# Patient Record
Sex: Female | Born: 1960 | Race: Black or African American | Hispanic: No | Marital: Single | Smoking: Never smoker
Health system: Southern US, Community
[De-identification: ages and names within clinical notes are randomized; demographics above are authoritative.]

## PROBLEM LIST (undated history)

## (undated) DIAGNOSIS — I10 Essential (primary) hypertension: Secondary | ICD-10-CM

## (undated) DIAGNOSIS — E119 Type 2 diabetes mellitus without complications: Secondary | ICD-10-CM

## (undated) DIAGNOSIS — K219 Gastro-esophageal reflux disease without esophagitis: Secondary | ICD-10-CM

## (undated) HISTORY — PX: KNEE ARTHROSCOPY: SUR90

## (undated) HISTORY — PX: CARPAL TUNNEL RELEASE: SHX101

## (undated) HISTORY — PX: LAPAROSCOPIC GASTRIC SLEEVE RESECTION: SHX5895

## (undated) HISTORY — DX: Essential (primary) hypertension: I10

---

## 2011-06-24 ENCOUNTER — Ambulatory Visit (INDEPENDENT_AMBULATORY_CARE_PROVIDER_SITE_OTHER): Payer: Managed Care, Other (non HMO)

## 2011-06-24 DIAGNOSIS — R5383 Other fatigue: Secondary | ICD-10-CM

## 2011-06-24 DIAGNOSIS — R05 Cough: Secondary | ICD-10-CM

## 2011-07-25 ENCOUNTER — Ambulatory Visit (INDEPENDENT_AMBULATORY_CARE_PROVIDER_SITE_OTHER): Payer: Managed Care, Other (non HMO) | Admitting: Family Medicine

## 2011-07-25 VITALS — BP 168/123 | HR 85 | Temp 98.7°F | Resp 18 | Ht 64.0 in | Wt 220.6 lb

## 2011-07-25 DIAGNOSIS — I10 Essential (primary) hypertension: Secondary | ICD-10-CM

## 2011-07-25 DIAGNOSIS — M79609 Pain in unspecified limb: Secondary | ICD-10-CM

## 2011-07-25 DIAGNOSIS — G5600 Carpal tunnel syndrome, unspecified upper limb: Secondary | ICD-10-CM

## 2011-07-25 DIAGNOSIS — M79601 Pain in right arm: Secondary | ICD-10-CM

## 2011-07-25 NOTE — Patient Instructions (Addendum)
Restart blood pressure medicine.  Check your blood pressure outside of office, and return to clinic in next few weeks if numbers remain above 140/90.  Avoid repetitive use of right arm/wrist as able, use brace at work as needed, and when sleeping. Ok to take tylenol for now, the ibuprofen over the counter when blood pressure is better. We will refer you to the hand specialist.  Carpal Tunnel Syndrome The carpal tunnel is a narrow hollow area in the wrist. It is formed by the wrist bones and ligaments. Nerves, blood vessels, and tendons (cord like structures which attach muscle to bone) on the palm side (the side of your hand in the direction your fingers bend) of your hand pass through the carpal tunnel. Repeated wrist motion or certain diseases may cause swelling within the tunnel. (That is why these are called repetitive trauma (damage caused by over use) disorders. It is also a common problem in late pregnancy.) This swelling pinches the main nerve in the wrist (median nerve) and causes the painful condition called carpal tunnel syndrome. A feeling of "pins and needles" may be noticed in the fingers or hand; however, the entire arm may ache from this condition. Carpal tunnel syndrome may clear up by itself. Cortisone injections may help. Sometimes, an operation may be needed to free the pinched nerve. An electromyogram (a type of test) may be needed to confirm this diagnosis (learning what is wrong). This is a test which measures nerve conduction. The nerve conduction is usually slowed in a carpal tunnel syndrome. HOME CARE INSTRUCTIONS   If your caregiver prescribed medication to help reduce swelling, take as directed.   If you were given a splint to keep your wrist from bending, use it as instructed. It is important to wear the splint at night. Use the splint for as long as you have pain or numbness in your hand, arm or wrist. This may take 1 to 2 months.   If you have pain at night, it may help to  rub or shake your hand, or elevate your hand above the level of your heart (the center of your chest).   It is important to give your wrist a rest by stopping the activities that are causing the problem. If your symptoms (problems) are work-related, you may need to talk to your employer about changing to a job that does not require using your wrist.   Only take over-the-counter or prescription medicines for pain, discomfort, or fever as directed by your caregiver.   Following periods of extended use, particularly strenuous use, apply an ice pack wrapped in a towel to the anterior (palm) side of the affected wrist for 20 to 30 minutes. Repeat as needed three to four times per day. This will help reduce the swelling.   Follow all instructions for follow-up with your caregiver. This includes any orthopedic referrals, physical therapy, and rehabilitation. Any delay in obtaining necessary care could result in a delay or failure of your condition to heal.  SEEK IMMEDIATE MEDICAL CARE IF:   You are still having pain and numbness following a week of treatment.   You develop new, unexplained symptoms.   Your current symptoms are getting worse and are not helped or controlled with medications.  MAKE SURE YOU:   Understand these instructions.   Will watch your condition.   Will get help right away if you are not doing well or get worse.  Document Released: 05/10/2000 Document Revised: 01/23/2011 Document Reviewed: 12/16/2007 ExitCare Patient  Information 2012 [place], [place].

## 2011-07-25 NOTE — Progress Notes (Signed)
  Subjective:    Patient ID: Haani Bakula, female    DOB: 1960/07/28, 51 y.o.   MRN: [id number]  Hand Pain  Pertinent negatives include no chest pain.   Kelli Robeck is a 51 y.o. female  Hx of HTN. Forgot dose last night.  HA this am, but no other new sx's. BP usually 130's/90's.  R arm swelling this time of year for past 10 years.  Seems worse every year.  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  R handed. Able to "pop" elbow to try to get relief, but soreness/pull down inside of forearm.  Tx: none  SH: nonsmoker   Review of Systems  Respiratory: Negative for chest tightness and shortness of breath.   Cardiovascular: Negative for chest pain.  Musculoskeletal: Positive for myalgias and joint swelling.  Skin: Negative for color change and rash.  Neurological: Positive for headaches. Negative for dizziness, syncope, speech difficulty, weakness and light-headedness.       Objective:   Physical Exam  Constitutional: She is oriented to person, place, and time. She appears well-developed and well-nourished.  HENT:  Head: Normocephalic and atraumatic.  Pulmonary/Chest: Effort normal.  Musculoskeletal:       Right elbow: She exhibits normal range of motion and no swelling. no tenderness found. No radial head, no medial epicondyle, no lateral epicondyle and no olecranon process tenderness noted.       Right wrist: She exhibits swelling. She exhibits normal range of motion, no tenderness, no crepitus and no deformity.       Arms: Neurological: She is alert and oriented to person, place, and time.  Skin: Skin is warm and dry.  Psychiatric: She has a normal mood and affect.          Assessment & Plan:  Fronia Depass is a 51 y.o. female  1. Pain, arm, right  Wrist brace cock up volar, Ambulatory referral to Orthopedic Surgery  2. Carpal tunnel syndrome  Wrist brace cock up volar, Ambulatory referral to  Orthopedic Surgery  3. 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 Now with decreased grip - likely carpal tunnel component. Brace, handout, tylenol prn (until BP under better control, then otc ibuprofen ok) and refer to hand specialist for eval.  HTN - uncontrolled this am as didn't take meds last night.  Instructed to restart meds, check home BP's, and RTC if >140/90.    Return to the clinic or go to the nearest emergency room if any symptoms worsen or new symptoms occur.

## 2011-08-09 ENCOUNTER — Ambulatory Visit (INDEPENDENT_AMBULATORY_CARE_PROVIDER_SITE_OTHER): Payer: Managed Care, Other (non HMO) | Admitting: Internal Medicine

## 2011-08-09 VITALS — BP 136/92 | HR 94 | Temp 98.7°F | Resp 16 | Ht 63.5 in | Wt 221.0 lb

## 2011-08-09 DIAGNOSIS — J302 Other seasonal allergic rhinitis: Secondary | ICD-10-CM

## 2011-08-09 DIAGNOSIS — J309 Allergic rhinitis, unspecified: Secondary | ICD-10-CM

## 2011-08-09 DIAGNOSIS — I10 Essential (primary) hypertension: Secondary | ICD-10-CM

## 2011-08-09 DIAGNOSIS — R42 Dizziness and giddiness: Secondary | ICD-10-CM

## 2011-08-09 LAB — GLUCOSE, POCT (MANUAL RESULT ENTRY): POC Glucose: 95

## 2011-08-09 MED ORDER — BECLOMETHASONE DIPROPIONATE 40 MCG/ACT IN AERS: 2.0000 | INHALATION_SPRAY | Freq: Two times a day (BID) | RESPIRATORY_TRACT | Status: AC

## 2011-08-09 MED ORDER — METOPROLOL SUCCINATE ER 25 MG PO TB24: 25.0000 mg | ORAL_TABLET | Freq: Every day | ORAL | Status: AC

## 2011-08-09 NOTE — Progress Notes (Signed)
  Subjective:    Patient ID: Sheila Benson, female    DOB: 07-Apr-1961, 51 y.o.   MRN: [id number]  HPI Headache dizzy Onset 2 days ago Moderate in severity bp check today 154/103 then 129/96 at work No cp sob No fever Uses otc allergy med   Review of Systems  Constitutional: Negative.        Dizzy  HENT: Negative.   Eyes: Negative.   Respiratory: Negative.   Cardiovascular: Negative.   Gastrointestinal: Negative.   Genitourinary: Negative.   Musculoskeletal: Negative.   Neurological: Negative.   Hematological: Negative.   All other systems reviewed and are negative.       Objective:   Physical Exam  Nursing note and vitals reviewed. Constitutional: She is oriented to person, place, and time. She appears well-developed and well-nourished.  HENT:  Head: Normocephalic and atraumatic.  Right Ear: External ear normal.  Left Ear: External ear normal.  Eyes: Conjunctivae and EOM are normal. Pupils are equal, round, and reactive to light.  Neck: Normal range of motion. Neck supple.  Cardiovascular: Normal rate, regular rhythm and normal heart sounds.   Pulmonary/Chest: Effort normal and breath sounds normal.  Abdominal: Soft. Bowel sounds are normal.  Musculoskeletal: Normal range of motion.  Neurological: She is alert and oriented to person, place, and time.  Skin: Skin is warm.  Psychiatric: She has a normal mood and affect. Her behavior is normal. Judgment and thought content normal.     Results for orders placed in visit on 08/09/11  GLUCOSE, POCT (MANUAL RESULT ENTRY)      Component Value Range   POC Glucose 95        Assessment & Plan:  Reviewed previous visit bps diastolic usually 98. Today 136/92 and symptomatic. Will start metoprolol mg . Continue losartin hctz . Stop allergy otc antihistamine start qvar. fam hx of diabetes in 6 siblings will check sugar

## 2011-08-09 NOTE — Patient Instructions (Signed)
Start metoprolol 25 mg 1 tab daily. Continue your other bp med. Start q var and stop the otc allergy medication in one week. Return in one week fo  rbp recheck

## 2011-12-26 HISTORY — PX: APPENDECTOMY: SHX54

## 2012-02-24 ENCOUNTER — Ambulatory Visit (INDEPENDENT_AMBULATORY_CARE_PROVIDER_SITE_OTHER): Payer: Managed Care, Other (non HMO) | Admitting: Family Medicine

## 2012-02-24 ENCOUNTER — Ambulatory Visit: Payer: Managed Care, Other (non HMO)

## 2012-02-24 VITALS — BP 130/90 | HR 102 | Temp 98.1°F | Resp 18 | Ht 64.0 in | Wt 215.8 lb

## 2012-02-24 DIAGNOSIS — R109 Unspecified abdominal pain: Secondary | ICD-10-CM

## 2012-02-24 LAB — POCT CBC
HCT, POC: 37.9 % (ref 37.7–47.9)
Hemoglobin: 11.5 g/dL — AB (ref 12.2–16.2)
MCH, POC: 27.6 pg (ref 27–31.2)
MPV: 7.9 fL (ref 0–99.8)
POC MID %: 7.5 %M (ref 0–12)
RBC: 4.16 M/uL (ref 4.04–5.48)
WBC: 6.3 10*3/uL (ref 4.6–10.2)

## 2012-02-24 NOTE — Progress Notes (Signed)
[facility] [address] [place] [address] [phone]  Date:  02/24/2012   Name:  Sheila Benson   DOB:  Jun 09, 1960   MRN:  [id number]  PCP:  No primary provider on file.    Chief Complaint: Post-op Problem   History of Present Illness:  Sheila Benson is a 51 y.o. very pleasant female patient who presents with the following:  She was in [place] to visit family at the end of August- she went to the ED with pain, and was diagnosed with a UTI.  She then went back a couple of days later and was diagnosed with a ruptured, gangrenous appendix.  She had an appendectomy and was kept at the hospital for 9 days.  She is still on oral antibiotics.  She still has pain in her abdomen- it is slowly getting better, but she is not close to well yet.  She is supposed to return to work on 02/26/12 but she does not yet feel that she is able.  She needs to change position frequently in order to stay comfortable, and is having frequent BMs especially after eating.  She is not vomiting and is able to tolerate a bland diet. She has not had a fever or any other sign of return of an acute illness  There is no problem list on file for this patient.   No past medical history on file.  No past surgical history on file.  History  Substance Use Topics  . Smoking status: Never Smoker   . Smokeless tobacco: Never Used  . Alcohol Use: Not on file    No family history on file.  No Known Allergies  Medication list has been reviewed and updated.  Current Outpatient Prescriptions on File Prior to Visit  Medication Sig Dispense Refill  . losartan-hydrochlorothiazide (HYZAAR) 100-12.5 MG per tablet Take 1 tablet by mouth daily.      . metoprolol succinate (TOPROL-XL) 25 MG 24 hr tablet Take 1 tablet (25 mg total) by mouth daily.  90 tablet  3  . beclomethasone (QVAR) 40 MCG/ACT inhaler Inhale 2 puffs into the lungs 2 (two) times daily.  1 Inhaler  12    Review of Systems:  As per  HPI- otherwise negative.   Physical Examination: Filed Vitals:   02/24/12 1416  BP: 130/90  Pulse: 102  Temp: 98.1 F (36.7 C)  Resp: 18   Filed Vitals:   02/24/12 1416  Height: 5\' 4"  (1.626 m)  Weight: 215 lb 12.8 oz (97.886 kg)   Body mass index is 37.04 kg/(m^2). Ideal Body Weight: Weight in (lb) to have BMI = 25: 145.3   GEN: WDWN, NAD, Non-toxic, A & O x 3, obese HEENT: Atraumatic, Normocephalic. Neck supple. No masses, No LAD. Ears and Nose: No external deformity. CV: RRR, No M/G/R. No JVD. No thrill. No extra heart sounds. PULM: CTA B, no wheezes, crackles, rhonchi. No retractions. No resp. distress. No accessory muscle use. ABD: S, +BS. No rebound. No HSM.  Healing laparotomy incisions- no sign of infection.  She is tender over her entire abdomen- no particular point tenderness.  EXTR: No c/c/e NEURO Normal gait.  PSYCH: Normally interactive. Conversant. Not depressed or anxious appearing.  Calm demeanor.   Results for orders placed in visit on 02/24/12  POCT CBC      Component Value Range   WBC 6.3  4.6 - 10.2 K/uL   Lymph, poc 2.8  0.6 - 3.4  POC LYMPH PERCENT 44.5  10 - 50 %L   MID (cbc) 0.5  0 - 0.9   POC MID % 7.5  0 - 12 %M   POC Granulocyte 3.0  2 - 6.9   Granulocyte percent 48.0  37 - 80 %G   RBC 4.16  4.04 - 5.48 M/uL   Hemoglobin 11.5 (*) 12.2 - 16.2 g/dL   HCT, POC 56.2  13.0 - 47.9 %   MCV 91.0  80 - 97 fL   MCH, POC 27.6  27 - 31.2 pg   MCHC 30.3 (*) 31.8 - 35.4 g/dL   RDW, POC 86.5     Platelet Count, POC 305  142 - 424 K/uL   MPV 7.9  0 - 99.8 fL   [facility] reading (PRIMARY) by  Dr. Patsy Lager.  Abdominal series:  No ileus or air- fluid levels  Clinical Data: Pain. Gas  ACUTE ABDOMEN SERIES (ABDOMEN 2 VIEW & CHEST 1 VIEW)  Comparison: None  Findings: The heart size and mediastinal contours are within normal limits. Both lungs are clear. The visualized skeletal structures are unremarkable.  The bowel gas pattern appears nonobstructed.  There are no dilated loops of small bowel or air-fluid levels. No abnormal abdominal or pelvic calcifications. Surgical clip is noted within the right lower quadrant of the abdomen.  IMPRESSION:  1. Nonobstructive bowel gas pattern.  Assessment and Plan: 1. Abdominal  pain, other specified site  POCT CBC, DG Abd Acute W/Chest   Sheila Benson is getting over a very bad illness- she had a ruptured and gangrenous appendix.  Her bowels are still healing, and she continues to have pain.  She is not ready to RTW yet.  Wrote her a new RTW note- we plan to have her return in 3 weeks, but she will come and see me for a recheck prior to her return.  If she does not continue to have steady improvement she is to let me know- sooner if she starts to get worse.    Abbe Amsterdam, MD  Faxed a note for her job- [phone] attn Barbaraann Faster

## 2012-03-02 ENCOUNTER — Telehealth: Payer: Self-pay | Admitting: *Deleted

## 2012-03-02 NOTE — Telephone Encounter (Signed)
Called patient to check on her status.  Left message at: [phone] to call us back with her health status. Joniel Graumann, Marion Downer

## 2012-03-02 NOTE — Telephone Encounter (Signed)
She called back and I talked with her- she is getting better slowly but surely

## 2012-03-14 ENCOUNTER — Ambulatory Visit (INDEPENDENT_AMBULATORY_CARE_PROVIDER_SITE_OTHER): Payer: Managed Care, Other (non HMO) | Admitting: Family Medicine

## 2012-03-14 VITALS — BP 140/92 | HR 88 | Temp 98.1°F | Resp 16 | Ht 64.5 in | Wt 222.8 lb

## 2012-03-14 DIAGNOSIS — K3532 Acute appendicitis with perforation and localized peritonitis, without abscess: Secondary | ICD-10-CM

## 2012-03-14 DIAGNOSIS — Z9889 Other specified postprocedural states: Secondary | ICD-10-CM

## 2012-03-14 DIAGNOSIS — R109 Unspecified abdominal pain: Secondary | ICD-10-CM

## 2012-03-14 NOTE — Patient Instructions (Addendum)
Try some OTC sleep aid medication such as doxylamine (unisom) for sleep.  Lets see you back in about 2 weeks.  Let me know sooner if any problems arise

## 2012-03-14 NOTE — Progress Notes (Signed)
[facility] [address], [place] [address] [phone]  Date:  03/14/2012   Name:  Sheila Benson   DOB:  [birth year]   MRN:  [id number]  PCP:  No primary provider on file.    Chief Complaint: Follow-up   History of Present Illness:  Sheila Benson is a 51 y.o. very pleasant female patient who presents with the following:  She is here to recheck her abdominal pain- she had a ruptured appy and a long hospitalizations recently.  She is better but is still not well- she still has some pain in her belly. She is able to eat ok.  No nausea or vomiting.   She is having a very hard time sleeping- she has not tried anything yet to help her sleep.   Her stool patterns is getting better but she still has some diarrhea.   She is not taking any medications for pain at this time  She works at [employer] and has to be able to lift 50 lbs regularly at her job.  She is able to cook and do light housework at home, and is eager to return to work.  However, she knows she cannot lift 50 lbs yet.   Her appendix ruptured in early September.  It has now been about 7 weeks since her illness.   She does not want a flu shot today  There is no problem list on file for this patient.   No past medical history on file.  Past Surgical History  Procedure Date  . Appendectomy 8/13    ruptured, long hospital stay    History  Substance Use Topics  . Smoking status: Never Smoker   . Smokeless tobacco: Never Used  . Alcohol Use: Not on file    No family history on file.  No Known Allergies  Medication list has been reviewed and updated.  Current Outpatient Prescriptions on File Prior to Visit  Medication Sig Dispense Refill  . losartan-hydrochlorothiazide (HYZAAR) 100-12.5 MG per tablet Take 1 tablet by mouth daily.      . metoprolol succinate (TOPROL-XL) 25 MG 24 hr tablet Take 1 tablet (25 mg total) by mouth daily.  90 tablet  3  . beclomethasone (QVAR) 40 MCG/ACT inhaler Inhale  2 puffs into the lungs 2 (two) times daily.  1 Inhaler  12    Review of Systems:  As per HPI- otherwise negative.   Physical Examination: Filed Vitals:   03/14/12 0906  BP: 140/92  Pulse: 88  Temp: 98.1 F (36.7 C)  Resp: 16   Filed Vitals:   03/14/12 0906  Height: 5' 4.5" (1.638 m)  Weight: 222 lb 12.8 oz (101.061 kg)   Body mass index is 37.65 kg/(m^2). Ideal Body Weight: Weight in (lb) to have BMI = 25: 147.6   GEN: WDWN, NAD, Non-toxic, A & O x 3, obese HEENT: Atraumatic, Normocephalic. Neck supple. No masses, No LAD.   Ears and Nose: No external deformity. CV: RRR, No M/G/R. No JVD. No thrill. No extra heart sounds. PULM: CTA B, no wheezes, crackles, rhonchi. No retractions. No resp. distress. No accessory muscle use. ABD: S, ND +BS. No rebound. No HSM.  She has generalized mild tenderness with palpation over most of her abdomen, most pronounced over her surgical scars.  Her wounds are well- healed and there is no sign of infection EXTR: No c/c/e NEURO Normal gait.  PSYCH: Normally interactive. Conversant. Not depressed or anxious appearing.  Calm demeanor.  Assessment and Plan: 1. Abdominal  pain, other specified site   2. Post-operative state    Sheila Benson is doing better, but she is only 7 weeks out from a major illness and operation.  She is eager to get back to work, but she cannot start lifting yet.  We will check her back in 2 weeks, and at that time we hope to allow her to RTW with lifting restrictions.  Recommended that she try some unisom for sleep.  She will try to increase her endurance by walking around her neighborhood.  Recheck in 2 weeks   Takiah Maiden, MD

## 2012-03-30 ENCOUNTER — Ambulatory Visit (INDEPENDENT_AMBULATORY_CARE_PROVIDER_SITE_OTHER): Payer: Managed Care, Other (non HMO) | Admitting: Family Medicine

## 2012-03-30 ENCOUNTER — Telehealth: Payer: Self-pay

## 2012-03-30 VITALS — BP 132/90 | HR 82 | Temp 98.3°F | Resp 18 | Ht 64.0 in | Wt 223.8 lb

## 2012-03-30 DIAGNOSIS — R109 Unspecified abdominal pain: Secondary | ICD-10-CM

## 2012-03-30 NOTE — Progress Notes (Signed)
[facility] [address] [place] [address] [phone]  Date:  03/30/2012   Name:  Sheila Benson   DOB:  August 14, 1960   MRN:  [id number]  PCP:  No primary provider on file.    Chief Complaint: Follow-up   History of Present Illness:  Sheila Benson is a 51 y.o. very pleasant female patient who presents with the following:  Here to recheck from a ruptured appendicitis- surgery on 01/28/12- and abdominal pain/ long recovery which has kept her out of work.  She was last here on 03/14/12 to follow-up.  At her last visit she was given a letter stating that we planned to have her RTW on 11/15 but that she would need a lifting restriction.  She works at [employer] and her job involves lifting up to 50 lbs.   She is here today to evaluate her RTW status.  Shann feels that overall she is getting better and she does want to RTW tomorrow.  She does notice some nasal congestion for the last few days.  No fever- she thinks she has allergies.  This is a separate issue today and she does not think it is anything serious,   Her abdomen is feeling better- however, she does have some pain if she pushes on her belly or lifts more than about 20 lbs.   She is eating normally, normal bowel function.   Her menses are irregular and she is peri- menopausal.  She did have a period right around the time of her operation which she thinks may be stress related but no bleeding since.   Patient Active Problem List  Diagnosis  . Ruptured appendicitis    No past medical history on file.  Past Surgical History  Procedure Date  . Appendectomy 8/13    ruptured, long hospital stay    History  Substance Use Topics  . Smoking status: Never Smoker   . Smokeless tobacco: Never Used  . Alcohol Use: Not on file    No family history on file.  No Known Allergies  Medication list has been reviewed and updated.  Current Outpatient Prescriptions on File Prior to Visit  Medication Sig Dispense  Refill  . losartan-hydrochlorothiazide (HYZAAR) 100-12.5 MG per tablet Take 1 tablet by mouth daily.      . metoprolol succinate (TOPROL-XL) 25 MG 24 hr tablet Take 1 tablet (25 mg total) by mouth daily.  90 tablet  3  . beclomethasone (QVAR) 40 MCG/ACT inhaler Inhale 2 puffs into the lungs 2 (two) times daily.  1 Inhaler  12    Review of Systems:  As per HPI- otherwise negative.   Physical Examination: Filed Vitals:   03/30/12 0757  BP: 151/103  Pulse: 82  Temp: 98.3 F (36.8 C)  Resp: 18   Filed Vitals:   03/30/12 0757  Height: 5\' 4"  (1.626 m)  Weight: 223 lb 12.8 oz (101.515 kg)   Body mass index is 38.42 kg/(m^2). Ideal Body Weight: Weight in (lb) to have BMI = 25: 145.3   GEN: WDWN, NAD, Non-toxic, A & O x 3, obese HEENT: Atraumatic, Normocephalic. Neck supple. No masses, No LAD. Ears and Nose: No external deformity. CV: RRR, No M/G/R. No JVD. No thrill. No extra heart sounds. PULM: CTA B, no wheezes, crackles, rhonchi. No retractions. No resp. distress. No accessory muscle use. ABD: S, ND.  She continues to have some tenderness around the scope incisions from her surgery.  Good active BS.  No sign of an acute abdomen and she does seem improved.  EXTR: No c/c/e NEURO Normal gait.  PSYCH: Normally interactive. Conversant. Not depressed or anxious appearing.  Calm demeanor.    Assessment and Plan: 1. Abdominal  pain, other specified site    Mamie is getting better- she is ready to return to work but we will employ a lifting restriction for the next couple of weeks at least.  Her job involves lifting up to 50 lbs which she will need to work up to as she is also now deconditioned.  Gave her a letter to RTW tomorrow.  She will call me in a couple of weeks and we may be able to advance her lifting.    Abbe Amsterdam, MD

## 2012-03-30 NOTE — Telephone Encounter (Signed)
Pt asked to speak with dr copland (who is seeing pts currently) about her rtw note. States her work told her it is not specific enough and now she is not getting paid for today, also mentioned something about insurance in regards to this.   Wants to speak with dr copland specifically; told pt amy would call her as soon as she can.  Best: [phone]  bf

## 2012-03-30 NOTE — Telephone Encounter (Signed)
Patient wants to know if you got her disability information from [employer], they have told patient information sent last week was not specific enough, there is a new work note in computer, generated today by dr Patsy Lager. Patient wants to make sure [employer] has gotten this note. She wants someone to call her about the disability papers

## 2012-06-08 ENCOUNTER — Other Ambulatory Visit: Payer: Self-pay | Admitting: *Deleted

## 2012-06-08 MED ORDER — LOSARTAN POTASSIUM-HCTZ 100-12.5 MG PO TABS: 1.0000 | ORAL_TABLET | Freq: Every day | ORAL | Status: DC

## 2013-04-15 ENCOUNTER — Ambulatory Visit (INDEPENDENT_AMBULATORY_CARE_PROVIDER_SITE_OTHER): Payer: Managed Care, Other (non HMO) | Admitting: Emergency Medicine

## 2013-04-15 ENCOUNTER — Telehealth: Payer: Self-pay

## 2013-04-15 VITALS — BP 160/108 | HR 86 | Temp 98.0°F | Resp 16 | Ht 64.0 in | Wt 235.6 lb

## 2013-04-15 DIAGNOSIS — R42 Dizziness and giddiness: Secondary | ICD-10-CM

## 2013-04-15 DIAGNOSIS — I10 Essential (primary) hypertension: Secondary | ICD-10-CM

## 2013-04-15 LAB — GLUCOSE, POCT (MANUAL RESULT ENTRY): POC Glucose: 102 mg/dl — AB (ref 70–99)

## 2013-04-15 LAB — POCT CBC
Granulocyte percent: 48 %G (ref 37–80)
HCT, POC: 41 % (ref 37.7–47.9)
Hemoglobin: 12.4 g/dL (ref 12.2–16.2)
Lymph, poc: 2.5 (ref 0.6–3.4)
MCH, POC: 27.7 pg (ref 27–31.2)
MCHC: 30.2 g/dL — AB (ref 31.8–35.4)
MCV: 91.8 fL (ref 80–97)
POC Granulocyte: 2.6 (ref 2–6.9)
POC LYMPH PERCENT: 46.3 %L (ref 10–50)
RDW, POC: 16.5 %
WBC: 5.5 10*3/uL (ref 4.6–10.2)

## 2013-04-15 MED ORDER — METOPROLOL TARTRATE 50 MG PO TABS: 50.0000 mg | ORAL_TABLET | Freq: Two times a day (BID) | ORAL | Status: DC

## 2013-04-15 MED ORDER — DIAZEPAM 2 MG PO TABS: 2.0000 mg | ORAL_TABLET | Freq: Four times a day (QID) | ORAL | Status: DC | PRN

## 2013-04-15 NOTE — Patient Instructions (Signed)
Vertigo Vertigo means you feel like you or your surroundings are moving when they are not. Vertigo can be dangerous if it occurs when you are at work, driving, or performing difficult activities.  CAUSES  Vertigo occurs when there is a conflict of signals sent to your brain from the visual and sensory systems in your body. There are many different causes of vertigo, including:  Infections, especially in the inner ear.  A bad reaction to a drug or misuse of alcohol and medicines.  Withdrawal from drugs or alcohol.  Rapidly changing positions, such as lying down or rolling over in bed.  A migraine headache.  Decreased blood flow to the brain.  Increased pressure in the brain from a head injury, infection, tumor, or bleeding. SYMPTOMS  You may feel as though the world is spinning around or you are falling to the ground. Because your balance is upset, vertigo can cause nausea and vomiting. You may have involuntary eye movements (nystagmus). DIAGNOSIS  Vertigo is usually diagnosed by physical exam. If the cause of your vertigo is unknown, your caregiver may perform imaging tests, such as an MRI scan (magnetic resonance imaging). TREATMENT  Most cases of vertigo resolve on their own, without treatment. Depending on the cause, your caregiver may prescribe certain medicines. If your vertigo is related to body position issues, your caregiver may recommend movements or procedures to correct the problem. In rare cases, if your vertigo is caused by certain inner ear problems, you may need surgery. HOME CARE INSTRUCTIONS   Follow your caregiver's instructions.  Avoid driving.  Avoid operating heavy machinery.  Avoid performing any tasks that would be dangerous to you or others during a vertigo episode.  Tell your caregiver if you notice that certain medicines seem to be causing your vertigo. Some of the medicines used to treat vertigo episodes can actually make them worse in some people. SEEK  IMMEDIATE MEDICAL CARE IF:   Your medicines do not relieve your vertigo or are making it worse.  You develop problems with talking, walking, weakness, or using your arms, hands, or legs.  You develop severe headaches.  Your nausea or vomiting continues or gets worse.  You develop visual changes.  A family member notices behavioral changes.  Your condition gets worse. MAKE SURE YOU:  Understand these instructions.  Will watch your condition.  Will get help right away if you are not doing well or get worse. Document Released: 02/20/2005 Document Revised: 08/05/2011 Document Reviewed: 11/29/2010 [employer] Patient Information 2014 [employer].  

## 2013-04-15 NOTE — Telephone Encounter (Signed)
Patient wants to know if she is supposed to take her losartan with her metoprolol.   [phone]

## 2013-04-15 NOTE — Telephone Encounter (Signed)
Yes she is on both losartan and metoprolol

## 2013-04-15 NOTE — Progress Notes (Addendum)
Subjective:    Patient ID: Sheila Benson, female    DOB: 25-Feb-1961, 52 y.o.   MRN: [id number] This chart was scribed for Collene Gobble, MD by Valera Castle, ED Scribe. This patient was seen in room 13 and the patient's care was started at 11:16 AM.  HPI HPI Comments: Sheila Benson is a 52 y.o. female who presents to the Emergency Department complaining of sudden, moderate, intermittent dizziness, that lasts about 5 seconds during an episode, onset 2 days ago. She reports that bending over and light stimuli exacerbates the dizziness. She reports an associated pressure-like headache.   BP Recheck in room 170/110 - She reports taking her BP medicine regularly (Hyzaar), but states she has not taken it yet today.  She reports h/o left knee surgery on 03/11/2013. She states she has been doing therapy, and reports some mild pain, but states that she is doing well. She uses a crutch, and denies being back to work since her knee surgery.  She denies nausea, emesis, and any other associated symptoms.   PCP - No primary provider on file.  Patient Active Problem List   Diagnosis Date Noted  . Ruptured appendicitis 03/14/2012   History reviewed. No pertinent past medical history. Past Surgical History  Procedure Laterality Date  . Appendectomy  8/13    ruptured, long hospital stay   No Known Allergies Prior to Admission medications   Medication Sig Start Date End Date Taking? Authorizing Provider  losartan-hydrochlorothiazide (HYZAAR) 100-12.5 MG per tablet Take 1 tablet by mouth daily. 06/08/12  Yes Morrell Riddle, PA-C  metoprolol tartrate (LOPRESSOR) 25 MG tablet Take 25 mg by mouth 2 (two) times daily.   Yes Historical Provider, MD    Review of Systems  Gastrointestinal: Negative for nausea and vomiting.  Musculoskeletal: Positive for arthralgias (left knee).  Neurological: Positive for dizziness and headaches. Negative for syncope.      Objective:   Physical Exam Nursing note and vitals  reviewed. Constitutional: Pt is oriented to person, place, and time. Pt appears well-developed and well-nourished. No distress.  HENT:  Head: Normocephalic and atraumatic.  Eyes: EOM are normal. Pupils are equal, round, and reactive to light.  Neck: Neck supple. No tracheal deviation present.  Cardiovascular: Normal rate, regular rhythm and normal heart sounds.  Exam reveals no gallop and no friction rub. No murmur heard. Pulmonary/Chest: Effort normal and breath sounds normal. No respiratory distress. Pt has no wheezes. Pt has no rales.  Abdominal: Soft. Bowel sounds are normal. There is no tenderness. There is no rebound and no guarding.  Musculoskeletal: Normal range of motion except for the left knee. She had surgery on her knee and walks with the assistance of a crutch.  Neurological: Pt is alert and oriented to person, place, and time. Patient describes vertigo with changes in head position.  Skin: Skin is warm and dry.  Psychiatric: Pt has a normal mood and affect. Pt's behavior is normal.  Results for orders placed in visit on 04/15/13  POCT CBC      Result Value Range   WBC 5.5  4.6 - 10.2 K/uL   Lymph, poc 2.5  0.6 - 3.4   POC LYMPH PERCENT 46.3  10 - 50 %L   MID (cbc) 0.3  0 - 0.9   POC MID % 5.7  0 - 12 %M   POC Granulocyte 2.6  2 - 6.9   Granulocyte percent 48.0  37 - 80 %G   RBC 4.47  4.04 -  5.48 M/uL   Hemoglobin 12.4  12.2 - 16.2 g/dL   HCT, POC 69.6  29.5 - 47.9 %   MCV 91.8  80 - 97 fL   MCH, POC 27.7  27 - 31.2 pg   MCHC 30.2 (*) 31.8 - 35.4 g/dL   RDW, POC 28.4     Platelet Count, POC 323  142 - 424 K/uL   MPV 7.9  0 - 99.8 fL  GLUCOSE, POCT (MANUAL RESULT ENTRY)      Result Value Range   POC Glucose 102 (*) 70 - 99 mg/dl   Triage Vitals: BP 132/440  Pulse 86  Temp(Src) 98 F (36.7 C) (Oral)  Resp 16  Ht 5\' 4"  (1.626 m)  Wt 235 lb 9.6 oz (106.867 kg)  BMI 40.42 kg/m2  SpO2 99%  LMP 01/27/2012     Assessment & Plan:  Symptoms are most consistent  with benign positional vertigo. She certainly can have an otolith. Her blood pressure is not under control. Routine labs were done. We'll increase her Lopressor to 50 mg twice a day.. I did place her on Valium 2 mg 3 times a day to take for her vertiginous symptoms. If symptoms persist she will need an MRI.. I told her how important it was for her to take her blood pressure medications regularly and not to skip any doses     I personally performed the services described in this documentation, which was scribed in my presence. The recorded information has been reviewed and is accurate.

## 2013-04-16 LAB — COMPREHENSIVE METABOLIC PANEL
Albumin: 4.2 g/dL (ref 3.5–5.2)
CO2: 26 mEq/L (ref 19–32)
Chloride: 103 mEq/L (ref 96–112)
Creat: 0.75 mg/dL (ref 0.50–1.10)
Glucose, Bld: 100 mg/dL — ABNORMAL HIGH (ref 70–99)
Total Bilirubin: 0.5 mg/dL (ref 0.3–1.2)

## 2013-04-16 NOTE — Telephone Encounter (Signed)
Notified pt that she is to be on both BP meds.

## 2013-07-06 ENCOUNTER — Other Ambulatory Visit: Payer: Self-pay | Admitting: Physician Assistant

## 2013-12-02 ENCOUNTER — Other Ambulatory Visit: Payer: Self-pay | Admitting: Emergency Medicine

## 2013-12-13 ENCOUNTER — Ambulatory Visit (INDEPENDENT_AMBULATORY_CARE_PROVIDER_SITE_OTHER): Payer: Managed Care, Other (non HMO) | Admitting: Family Medicine

## 2013-12-13 VITALS — BP 120/82 | HR 73 | Temp 98.4°F | Resp 18 | Ht 64.0 in | Wt 227.0 lb

## 2013-12-13 DIAGNOSIS — R5381 Other malaise: Secondary | ICD-10-CM

## 2013-12-13 DIAGNOSIS — R5383 Other fatigue: Principal | ICD-10-CM

## 2013-12-13 DIAGNOSIS — I10 Essential (primary) hypertension: Secondary | ICD-10-CM

## 2013-12-13 LAB — POCT UA - MICROSCOPIC ONLY
Bacteria, U Microscopic: NEGATIVE
CRYSTALS, UR, HPF, POC: NEGATIVE
Casts, Ur, LPF, POC: NEGATIVE
Mucus, UA: NEGATIVE
RBC, urine, microscopic: NEGATIVE
WBC, Ur, HPF, POC: NEGATIVE
YEAST UA: NEGATIVE

## 2013-12-13 LAB — CBC WITH DIFFERENTIAL/PLATELET
BASOS ABS: 0 10*3/uL (ref 0.0–0.1)
Basophils Relative: 0 % (ref 0–1)
EOS PCT: 2 % (ref 0–5)
Eosinophils Absolute: 0.1 10*3/uL (ref 0.0–0.7)
HEMATOCRIT: 37.3 % (ref 36.0–46.0)
Hemoglobin: 12.6 g/dL (ref 12.0–15.0)
Lymphocytes Relative: 43 % (ref 12–46)
Lymphs Abs: 2.5 10*3/uL (ref 0.7–4.0)
MCH: 28 pg (ref 26.0–34.0)
MCHC: 33.8 g/dL (ref 30.0–36.0)
MCV: 82.9 fL (ref 78.0–100.0)
Monocytes Absolute: 0.6 10*3/uL (ref 0.1–1.0)
Monocytes Relative: 10 % (ref 3–12)
NEUTROS ABS: 2.6 10*3/uL (ref 1.7–7.7)
Neutrophils Relative %: 45 % (ref 43–77)
PLATELETS: 284 10*3/uL (ref 150–400)
RBC: 4.5 MIL/uL (ref 3.87–5.11)
RDW: 16.6 % — AB (ref 11.5–15.5)
WBC: 5.7 10*3/uL (ref 4.0–10.5)

## 2013-12-13 LAB — POCT URINALYSIS DIPSTICK
Bilirubin, UA: NEGATIVE
Blood, UA: NEGATIVE
GLUCOSE UA: NEGATIVE
Ketones, UA: NEGATIVE
LEUKOCYTES UA: NEGATIVE
NITRITE UA: NEGATIVE
PROTEIN UA: NEGATIVE
SPEC GRAV UA: 1.015
UROBILINOGEN UA: 0.2
pH, UA: 5.5

## 2013-12-13 LAB — POCT URINE PREGNANCY: PREG TEST UR: NEGATIVE

## 2013-12-13 NOTE — Progress Notes (Addendum)
[facility] [address], [place] [address] [phone]  Date:  12/13/2013   Name:  Sheila Benson   DOB:  February 18, 1961   MRN:  [id number]  PCP:  No primary provider on file.    Chief Complaint: Headache and Fatigue   History of Present Illness:  Sheila Benson is a 53 y.o. very pleasant female patient who presents with the following:  She is here today with illness.  She has felt "tired and weak" for 2 weeks.  This am she felt lightheaded- no vertigo.   No cough, no fever, no distinct sx that she can think of- she is just tired.  No aches.  No runny nose or ST. No nausea, vomiting, no diarrhea.  She is eating ok. No urinary sx, no dysuria.  She notes some stress from being unhappy in her relationship, and work is a stress.  However she is not sure if she might be just depressed or if she is sick.  She is not sleeping much- she may sleep from 12:30 until 6am.   She had left knee surgery last September; she notes that her knee is hurting some again, and she has some pain in her left achilles as well.    She takes medication for her BP, but is otherwise generally healthy.   No CP.   Patient Active Problem List   Diagnosis Date Noted  . Ruptured appendicitis 03/14/2012    History reviewed. No pertinent past medical history.  Past Surgical History  Procedure Laterality Date  . Appendectomy  8/13    ruptured, long hospital stay    History  Substance Use Topics  . Smoking status: Never Smoker   . Smokeless tobacco: Never Used  . Alcohol Use: Not on file    History reviewed. No pertinent family history.  No Known Allergies  Medication list has been reviewed and updated.  Current Outpatient Prescriptions on File Prior to Visit  Medication Sig Dispense Refill  . losartan-hydrochlorothiazide (HYZAAR) 100-12.5 MG per tablet TAKE 1 TABLET BY MOUTH DAILY.  30 tablet  0  . metoprolol tartrate (LOPRESSOR) 50 MG tablet Take 1 tablet (50 mg total) by mouth 2  (two) times daily.  60 tablet  11  . diazepam (VALIUM) 2 MG tablet Take 1 tablet (2 mg total) by mouth every 6 (six) hours as needed.  30 tablet  0   No current facility-administered medications on file prior to visit.    Review of Systems:  As per HPI- otherwise negative.   Physical Examination: Filed Vitals:   12/13/13 1546  BP: 120/82  Pulse: 73  Temp: 98.4 F (36.9 C)  Resp: 18   Filed Vitals:   12/13/13 1546  Height: 5\' 4"  (1.626 m)  Weight: 227 lb (102.967 kg)   Body mass index is 38.95 kg/(m^2). Ideal Body Weight: Weight in (lb) to have BMI = 25: 145.3  GEN: WDWN, NAD, Non-toxic, A & O x 3, obese, looks well HEENT: Atraumatic, Normocephalic. Neck supple. No masses, No LAD. Ears and Nose: No external deformity. CV: RRR, No M/G/R. No JVD. No thrill. No extra heart sounds. PULM: CTA B, no wheezes, crackles, rhonchi. No retractions. No resp. distress. No accessory muscle use. ABD: S, NT, ND, +BS. No rebound. No HSM. EXTR: No c/c/e NEURO Normal gait.  PSYCH: Normally interactive. Conversant. Not depressed or anxious appearing.  Calm demeanor. She is slightly tender over the left distal achilles- no evidence of rupture  Wt Readings  from Last 3 Encounters:  12/13/13 227 lb (102.967 kg)  04/15/13 235 lb 9.6 oz (106.867 kg)  03/30/12 223 lb 12.8 oz (101.515 kg)    Assessment and Plan: Other malaise and fatigue - Plan: POCT UA - Microscopic Only, POCT urinalysis dipstick, POCT urine pregnancy, CBC with Differential, Comprehensive metabolic panel, TSH  Essential hypertension  Vague malaise and fatigue.  ?thyroid issues.  Check labs as above and will follow-up with her asap. Also consider depression- she is not sure if this is a true issue for her but will think about this.  She will follow-up with her orthopedist concerning left achilles likely tendinopathy  Signed Abbe Amsterdam, MD  Called to check on her on 7/21. Labs normal.  She reports that she did not go to  work today- felt tired and dizzy this am.  Explained that I am not sure why she is feeling this way.  She agreed to come and see me or call if she was not improving in the next 1-2 days

## 2013-12-13 NOTE — Patient Instructions (Signed)
So far all of your labs look good- I will be in touch with the rest of your labs in the next 1-2 days.  For the time being rest and try to reduce stress!

## 2013-12-14 LAB — COMPREHENSIVE METABOLIC PANEL
ALK PHOS: 56 U/L (ref 39–117)
ALT: 14 U/L (ref 0–35)
AST: 17 U/L (ref 0–37)
Albumin: 4.2 g/dL (ref 3.5–5.2)
BUN: 18 mg/dL (ref 6–23)
CO2: 28 mEq/L (ref 19–32)
Calcium: 9.4 mg/dL (ref 8.4–10.5)
Chloride: 103 mEq/L (ref 96–112)
Creat: 0.77 mg/dL (ref 0.50–1.10)
Glucose, Bld: 95 mg/dL (ref 70–99)
Potassium: 4.1 mEq/L (ref 3.5–5.3)
SODIUM: 140 meq/L (ref 135–145)
Total Bilirubin: 0.5 mg/dL (ref 0.2–1.2)
Total Protein: 7.5 g/dL (ref 6.0–8.3)

## 2013-12-14 LAB — TSH: TSH: 1.787 u[IU]/mL (ref 0.350–4.500)

## 2013-12-15 ENCOUNTER — Encounter: Payer: Self-pay | Admitting: Family Medicine

## 2014-01-17 ENCOUNTER — Other Ambulatory Visit: Payer: Self-pay | Admitting: Physician Assistant

## 2014-06-20 ENCOUNTER — Other Ambulatory Visit: Payer: Self-pay | Admitting: Emergency Medicine

## 2014-07-20 ENCOUNTER — Other Ambulatory Visit: Payer: Self-pay | Admitting: Gastroenterology

## 2014-07-20 DIAGNOSIS — R1011 Right upper quadrant pain: Secondary | ICD-10-CM

## 2014-07-27 ENCOUNTER — Ambulatory Visit
Admission: RE | Admit: 2014-07-27 | Discharge: 2014-07-27 | Disposition: A | Discharge status: Home / Self Care | Payer: Managed Care, Other (non HMO) | Source: Ambulatory Visit | Attending: Gastroenterology | Admitting: Gastroenterology

## 2014-07-27 DIAGNOSIS — R1011 Right upper quadrant pain: Secondary | ICD-10-CM

## 2014-10-31 ENCOUNTER — Ambulatory Visit (INDEPENDENT_AMBULATORY_CARE_PROVIDER_SITE_OTHER): Payer: Managed Care, Other (non HMO) | Admitting: Family Medicine

## 2014-10-31 VITALS — BP 120/74 | HR 74 | Temp 98.7°F | Resp 18 | Ht 64.0 in | Wt 234.6 lb

## 2014-10-31 DIAGNOSIS — L810 Postinflammatory hyperpigmentation: Secondary | ICD-10-CM

## 2014-10-31 DIAGNOSIS — H109 Unspecified conjunctivitis: Secondary | ICD-10-CM

## 2014-10-31 MED ORDER — CIPROFLOXACIN HCL 0.3 % OP SOLN: 1.0000 [drp] | OPHTHALMIC | Status: DC

## 2014-10-31 NOTE — Progress Notes (Signed)
[facility] [address], [place] [address] [phone]  Date:  10/31/2014   Name:  Sheila Benson   DOB:  Sep 28, 1960   MRN:  [id number]  PCP:  No primary care provider on file.    Chief Complaint: Eye Problem and Spot On Leg   History of Present Illness:  Sheila Benson is a 54 y.o. very pleasant female patient who presents with the following:  Here today with eye concern: she notes a problem with her left eye for about one month . She has tried some allergy drops OTC- she does not have them with her.  She has noticed redness off and on. It started when she was visiting her [place]- she noted crusting at first, used the drops.  Now has no more crusting, but the eye is red and feels irritated.   She will have photophobia in the am only She is not able to wear her contacts right now; her vision seems ok for being without contacts to her.  She has not used them since her eye issues began  Otherwise she is ok.  She is not aware of any exposure to pinkeye She has seen GI and been told that she had heartburn  Patient Active Problem List   Diagnosis Date Noted  . Essential hypertension 12/13/2013  . Ruptured appendicitis 03/14/2012    History reviewed. No pertinent past medical history.  Past Surgical History  Procedure Laterality Date  . Appendectomy  8/13    ruptured, long hospital stay  . Carpal tunnel release Left     History  Substance Use Topics  . Smoking status: Never Smoker   . Smokeless tobacco: Never Used  . Alcohol Use: Not on file    History reviewed. No pertinent family history.  No Known Allergies  Medication list has been reviewed and updated.  Current Outpatient Prescriptions on File Prior to Visit  Medication Sig Dispense Refill  . diazepam (VALIUM) 2 MG tablet Take 1 tablet (2 mg total) by mouth every 6 (six) hours as needed. 30 tablet 0  . losartan-hydrochlorothiazide (HYZAAR) 100-12.5 MG per tablet TAKE 1 TABLET  BY MOUTH DAILY. 30 tablet 4  . metoprolol (LOPRESSOR) 50 MG tablet TAKE 1 TABLET (50 MG TOTAL) BY MOUTH 2 (TWO) TIMES DAILY.  "NEEDS OFFICE VISIT FOR ADDITIONAL REFILLS" 60 tablet 0   No current facility-administered medications on file prior to visit.    Review of Systems:  As per HPI- otherwise negative.   Physical Examination: Filed Vitals:   10/31/14 1440  BP: 120/74  Pulse: 74  Temp: 98.7 F (37.1 C)  Resp: 18   Filed Vitals:   10/31/14 1440  Height: 5\' 4"  (1.626 m)  Weight: 234 lb 9.6 oz (106.414 kg)   Body mass index is 40.25 kg/(m^2). Ideal Body Weight: Weight in (lb) to have BMI = 25: 145.3  GEN: WDWN, NAD, Non-toxic, A & O x 3, obese, looks well except for eye HEENT: Atraumatic, Normocephalic. Neck supple. No masses, No LAD.  Bilateral TM wnl, oropharynx normal.  PEERL,EOMI.   Ears and Nose: No external deformity. CV: RRR, No M/G/R. No JVD. No thrill. No extra heart sounds. PULM: CTA B, no wheezes, crackles, rhonchi. No retractions. No resp. distress. No accessory muscle use. EXTR: No c/c/e NEURO Normal gait.  PSYCH: Normally interactive. Conversant. Not depressed or anxious appearing.  Calm demeanor.  Negative fluorescin LEFT eye.  Left eye is injected and irritated at conjunctivae  and sclerae There is an area of post- inflamm hyperpigmentation on her right shin which she has also noticed for a few weeks.  It seemed to start with some sort of pimple, but now if just itchy and does not seem to be going away.  It is smaller than a pencil eraser.  Slightly raised.   Assessment and Plan: Conjunctivitis of left eye - Plan: ciprofloxacin (CILOXAN) 0.3 % ophthalmic solution  Post-inflammatory hyperpigmentation  Treat for prolonged conjunctivitis with cipro drops,  Avoid contacts until sx resolved.  If not better in 2 days or so please call or RTC- Sooner if worse.   Avoid OTC allergy drops- they have have an anti- redness ingredient that is making her redness  worse  Try hydrocortisone cream for area on her leg BID for 1-2 weeks; however if this does not resolve she will let me know Signed Abbe Amsterdam, MD

## 2014-10-31 NOTE — Patient Instructions (Signed)
Use the antibiotic eye drops as prescribed.for the first 2 days try to use the drop closer to every 2 hours.  Avoid your contacts for at least one week! Stop using the OTC allergy drops- if you need to moisten your eyes please use a "natural tears" drop that does NOT fight redness Let me know if you are not well in 2-3 days- Sooner if worse.

## 2014-11-03 ENCOUNTER — Telehealth: Payer: Self-pay

## 2014-11-03 DIAGNOSIS — H5789 Other specified disorders of eye and adnexa: Secondary | ICD-10-CM

## 2014-11-03 NOTE — Telephone Encounter (Signed)
Dr Patsy Lager- Spoke with pt. She states she can't open her eyes, she has to wear sunglasses in the house. She has been using the drops. Please advise.

## 2014-11-03 NOTE — Telephone Encounter (Signed)
Pt was here on 6/6 and saw Dr. Patsy Lager for eye issues. Her eye isn't better, it's worse. I told her she should probably come on in. She would like a CB concerning this. Please advise at [phone]

## 2014-11-03 NOTE — Telephone Encounter (Signed)
Called her back- had to [facility].  Let her know that I will set up an urgent referral for her to see optho tomorrow.  If she does not hear about this appt please let me know.

## 2015-05-04 ENCOUNTER — Ambulatory Visit (INDEPENDENT_AMBULATORY_CARE_PROVIDER_SITE_OTHER): Payer: Managed Care, Other (non HMO) | Admitting: Family Medicine

## 2015-05-04 VITALS — BP 122/80 | HR 72 | Temp 98.1°F | Resp 18 | Ht 64.25 in | Wt 235.6 lb

## 2015-05-04 DIAGNOSIS — Z13220 Encounter for screening for lipoid disorders: Secondary | ICD-10-CM | POA: Diagnosis not present

## 2015-05-04 DIAGNOSIS — Z130 Encounter for screening for diseases of the blood and blood-forming organs and certain disorders involving the immune mechanism: Secondary | ICD-10-CM | POA: Diagnosis not present

## 2015-05-04 DIAGNOSIS — N898 Other specified noninflammatory disorders of vagina: Secondary | ICD-10-CM | POA: Diagnosis not present

## 2015-05-04 DIAGNOSIS — Z0000 Encounter for general adult medical examination without abnormal findings: Secondary | ICD-10-CM

## 2015-05-04 DIAGNOSIS — I10 Essential (primary) hypertension: Secondary | ICD-10-CM | POA: Diagnosis not present

## 2015-05-04 DIAGNOSIS — Z124 Encounter for screening for malignant neoplasm of cervix: Secondary | ICD-10-CM | POA: Diagnosis not present

## 2015-05-04 DIAGNOSIS — R21 Rash and other nonspecific skin eruption: Secondary | ICD-10-CM

## 2015-05-04 DIAGNOSIS — Z1239 Encounter for other screening for malignant neoplasm of breast: Secondary | ICD-10-CM

## 2015-05-04 LAB — POCT URINALYSIS DIP (MANUAL ENTRY)
Bilirubin, UA: NEGATIVE
Blood, UA: NEGATIVE
Glucose, UA: NEGATIVE
Ketones, POC UA: NEGATIVE
Leukocytes, UA: NEGATIVE
Nitrite, UA: NEGATIVE
Protein Ur, POC: NEGATIVE
Spec Grav, UA: 1.025
Urobilinogen, UA: 0.2
pH, UA: 5.5

## 2015-05-04 LAB — CBC
HCT: 37.6 % (ref 36.0–46.0)
Hemoglobin: 12.4 g/dL (ref 12.0–15.0)
MCH: 28.4 pg (ref 26.0–34.0)
MCHC: 33 g/dL (ref 30.0–36.0)
MCV: 86 fL (ref 78.0–100.0)
MPV: 9.3 fL (ref 8.6–12.4)
Platelets: 265 10*3/uL (ref 150–400)
RBC: 4.37 MIL/uL (ref 3.87–5.11)
RDW: 15.9 % — ABNORMAL HIGH (ref 11.5–15.5)
WBC: 6 10*3/uL (ref 4.0–10.5)

## 2015-05-04 LAB — COMPLETE METABOLIC PANEL WITH GFR
Albumin: 4 g/dL (ref 3.6–5.1)
Alkaline Phosphatase: 56 U/L (ref 33–130)
BUN: 20 mg/dL (ref 7–25)
Calcium: 9.2 mg/dL (ref 8.6–10.4)
GFR, Est Non African American: 88 mL/min (ref >=60)
Potassium: 4.5 mmol/L (ref 3.5–5.3)

## 2015-05-04 LAB — POCT WET + KOH PREP
Trich by wet prep: ABSENT
Yeast by KOH: ABSENT
Yeast by wet prep: ABSENT

## 2015-05-04 LAB — LIPID PANEL
Cholesterol: 213 mg/dL — ABNORMAL HIGH (ref 125–200)
HDL: 41 mg/dL — ABNORMAL LOW (ref >=46)
LDL Cholesterol: 134 mg/dL — ABNORMAL HIGH (ref <130)
Total CHOL/HDL Ratio: 5.2 Ratio — ABNORMAL HIGH (ref <=5.0)
Triglycerides: 190 mg/dL — ABNORMAL HIGH (ref <150)
VLDL: 38 mg/dL — ABNORMAL HIGH (ref <30)

## 2015-05-04 LAB — COMPLETE METABOLIC PANEL WITHOUT GFR
ALT: 15 U/L (ref 6–29)
AST: 17 U/L (ref 10–35)
CO2: 30 mmol/L (ref 20–31)
Chloride: 104 mmol/L (ref 98–110)
Creat: 0.77 mg/dL (ref 0.50–1.05)
GFR, Est African American: >89 mL/min (ref >=60)
Glucose, Bld: 99 mg/dL (ref 65–99)
Sodium: 141 mmol/L (ref 135–146)
Total Bilirubin: 0.5 mg/dL (ref 0.2–1.2)
Total Protein: 7.4 g/dL (ref 6.1–8.1)

## 2015-05-04 LAB — POC MICROSCOPIC URINALYSIS (UMFC)

## 2015-05-04 LAB — TSH: TSH: 2.079 u[IU]/mL (ref 0.350–4.500)

## 2015-05-04 MED ORDER — TRIAMCINOLONE ACETONIDE 0.1 % EX CREA: 1.0000 "application " | TOPICAL_CREAM | Freq: Two times a day (BID) | CUTANEOUS | Status: DC

## 2015-05-04 MED ORDER — METOPROLOL TARTRATE 50 MG PO TABS: ORAL_TABLET | ORAL | Status: DC

## 2015-05-04 MED ORDER — LOSARTAN POTASSIUM-HCTZ 100-12.5 MG PO TABS: 1.0000 | ORAL_TABLET | Freq: Every day | ORAL | Status: DC

## 2015-05-04 NOTE — Progress Notes (Signed)
Chief Complaint:  Chief Complaint  Patient presents with  . Annual Exam    with pap     HPI: Sheila Benson is a 54 y.o. 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 She has never had mammogram She is UTD on TD Does nto want flu vaccine She is UTD on colonscopy and EGD She has a small skin lesion on knee that she has been scratiching because it itches, no family hx of skin cancer  Past Medical History  Diagnosis Date  . Hypertension    Past Surgical History  Procedure Laterality Date  . Appendectomy  8/13    ruptured, long hospital stay  . Carpal tunnel release Left    Social History   Social History  . Marital Status: Single    Spouse Name: N/A  . Number of Children: N/A  . Years of Education: N/A   Social History Main Topics  . Smoking status: Never Smoker   . Smokeless tobacco: Never Used  . Alcohol Use: None  . Drug Use: None  . Sexual Activity: Not Asked   Other Topics Concern  . None   Social History Narrative   Family History  Problem Relation Age of Onset  . Diabetes Mother   . Hypertension Father   . Diabetes Sister   . Diabetes Brother   . Diabetes Brother   . Diabetes Sister    No Known Allergies Prior to Admission medications   Medication Sig Start Date End Date Taking? Authorizing Provider  losartan-hydrochlorothiazide (HYZAAR) 100-12.5 MG per tablet TAKE 1 TABLET BY MOUTH DAILY. 01/17/14  Yes Gay Filler Copland, MD  metoprolol (LOPRESSOR) 50 MG tablet TAKE 1 TABLET (50 MG TOTAL) BY MOUTH 2 (TWO) TIMES DAILY.  "NEEDS OFFICE VISIT FOR ADDITIONAL REFILLS" 06/21/14  Yes Mancel Bale, PA-C  diazepam (VALIUM) 2 MG tablet Take 1 tablet (2 mg total) by mouth every 6 (six) hours as needed. Patient not taking: Reported on 05/04/2015 04/15/13   Darlyne Russian, MD     ROS: The patient denies  fevers, chills, night sweats, unintentional weight loss, chest pain, palpitations, wheezing, dyspnea on exertion, nausea, vomiting, abdominal pain, dysuria, hematuria, melena, numbness, weakness, or tingling.   All other systems have been reviewed and were otherwise negative with the exception of those mentioned in the HPI and as above.    PHYSICAL EXAM: Filed Vitals:   05/04/15 1227  BP: 122/80  Pulse: 72  Temp: 98.1 F (36.7 C)  Resp: 18   Body mass index is 40.12 kg/(m^2).   General: Alert, no acute distress HEENT:  Normocephalic, atraumatic, oropharynx patent. EOMI, PERRLA, fundo and TM normal Cardiovascular:  Regular rate and rhythm, no rubs murmurs or gallops.  No Carotid bruits, radial pulse intact. No pedal edema.  Respiratory: Clear to auscultation bilaterally.  No wheezes, rales, or rhonchi.  No cyanosis, no use of accessory musculature Abdominal: No organomegaly, abdomen is soft and non-tender, positive bowel sounds. No masses. Skin: + eczema rash. Neurologic: Facial musculature symmetric. Psychiatric: Patient acts appropriately throughout our interaction. Lymphatic: No cervical or submandibular lymphadenopathy Musculoskeletal: Gait intact. No edema, tenderness GU normal, no rashes, masses,no adnexal pain or CMT or dc Breast exam normal   LABS: Results for orders placed or performed in visit on 12/13/13  CBC  with Differential  Result Value Ref Range   WBC 5.7 4.0 - 10.5 K/uL   RBC 4.50 3.87 - 5.11 MIL/uL   Hemoglobin 12.6 12.0 - 15.0 g/dL   HCT 37.3 36.0 - 46.0 %   MCV 82.9 78.0 - 100.0 fL   MCH 28.0 26.0 - 34.0 pg   MCHC 33.8 30.0 - 36.0 g/dL   RDW 16.6 (H) 11.5 - 15.5 %   Platelets 284 150 - 400 K/uL   Neutrophils Relative % 45 43 - 77 %   Neutro Abs 2.6 1.7 - 7.7 K/uL   Lymphocytes Relative 43 12 - 46 %   Lymphs Abs 2.5 0.7 - 4.0 K/uL   Monocytes Relative 10 3 - 12 %   Monocytes Absolute 0.6 0.1 - 1.0 K/uL   Eosinophils Relative 2 0 - 5 %   Eosinophils  Absolute 0.1 0.0 - 0.7 K/uL   Basophils Relative 0 0 - 1 %   Basophils Absolute 0.0 0.0 - 0.1 K/uL   Smear Review Criteria for review not met   Comprehensive metabolic panel  Result Value Ref Range   Sodium 140 135 - 145 mEq/L   Potassium 4.1 3.5 - 5.3 mEq/L   Chloride 103 96 - 112 mEq/L   CO2 28 19 - 32 mEq/L   Glucose, Bld 95 70 - 99 mg/dL   BUN 18 6 - 23 mg/dL   Creat 0.77 0.50 - 1.10 mg/dL   Total Bilirubin 0.5 0.2 - 1.2 mg/dL   Alkaline Phosphatase 56 39 - 117 U/L   AST 17 0 - 37 U/L   ALT 14 0 - 35 U/L   Total Protein 7.5 6.0 - 8.3 g/dL   Albumin 4.2 3.5 - 5.2 g/dL   Calcium 9.4 8.4 - 10.5 mg/dL  TSH  Result Value Ref Range   TSH 1.787 0.350 - 4.500 uIU/mL  POCT UA - Microscopic Only  Result Value Ref Range   WBC, Ur, HPF, POC negative    RBC, urine, microscopic negative    Bacteria, U Microscopic negative    Mucus, UA negative    Epithelial cells, urine per micros 0-1    Crystals, Ur, HPF, POC negative    Casts, Ur, LPF, POC negative    Yeast, UA negative   POCT urinalysis dipstick  Result Value Ref Range   Color, UA yellow    Clarity, UA clear    Glucose, UA negative    Bilirubin, UA negative    Ketones, UA negative    Spec Grav, UA 1.015    Blood, UA negative    pH, UA 5.5    Protein, UA negative    Urobilinogen, UA 0.2    Nitrite, UA negative    Leukocytes, UA Negative   POCT urine pregnancy  Result Value Ref Range   Preg Test, Ur Negative      EKG/XRAY:   Primary read interpreted by Dr. Marin Comment at [facility].   ASSESSMENT/PLAN: Encounter Diagnoses  Name Primary?  . Essential hypertension   . Annual physical exam Yes  . Screening for cervical cancer   . Screening for hyperlipidemia   . Screening for deficiency anemia   . Vaginal discharge    Declines flu vaccine Rx HTN meds and also trial triamcinolone cream, if no improvement for eczema excoriated rash then will refer to derm, she does nto want referral now Annual labs pending Fu prn, refer for  mammogram UTD on colonscopy  Gross sideeffects, risk and benefits, and alternatives  of medications d/w patient. Patient is aware that all medications have potential sideeffects and we are unable to predict every sideeffect or drug-drug interaction that may occur.  Deiona Hooper DO  05/04/2015 1:49 PM

## 2015-05-05 LAB — PAP IG, CT-NG, RFX HPV ASCU
Chlamydia Probe Amp: NOT DETECTED
GC Probe Amp: NOT DETECTED

## 2015-06-25 ENCOUNTER — Encounter: Payer: Self-pay | Admitting: Family Medicine

## 2015-06-25 ENCOUNTER — Ambulatory Visit (INDEPENDENT_AMBULATORY_CARE_PROVIDER_SITE_OTHER): Payer: Managed Care, Other (non HMO)

## 2015-06-25 ENCOUNTER — Ambulatory Visit (INDEPENDENT_AMBULATORY_CARE_PROVIDER_SITE_OTHER): Payer: Managed Care, Other (non HMO) | Admitting: Family Medicine

## 2015-06-25 VITALS — BP 130/80 | HR 87 | Temp 99.3°F | Resp 14 | Wt 239.0 lb

## 2015-06-25 DIAGNOSIS — R0602 Shortness of breath: Secondary | ICD-10-CM | POA: Diagnosis not present

## 2015-06-25 DIAGNOSIS — R059 Cough, unspecified: Secondary | ICD-10-CM

## 2015-06-25 DIAGNOSIS — R509 Fever, unspecified: Secondary | ICD-10-CM

## 2015-06-25 DIAGNOSIS — R05 Cough: Secondary | ICD-10-CM

## 2015-06-25 DIAGNOSIS — R0981 Nasal congestion: Secondary | ICD-10-CM

## 2015-06-25 DIAGNOSIS — J988 Other specified respiratory disorders: Secondary | ICD-10-CM

## 2015-06-25 DIAGNOSIS — R079 Chest pain, unspecified: Secondary | ICD-10-CM

## 2015-06-25 DIAGNOSIS — J22 Unspecified acute lower respiratory infection: Secondary | ICD-10-CM

## 2015-06-25 LAB — POCT CBC
Granulocyte percent: 54.9 % (ref 37–80)
HCT, POC: 37.7 % (ref 37.7–47.9)
Hemoglobin: 12.7 g/dL (ref 12.2–16.2)
Lymph, poc: 1.8 (ref 0.6–3.4)
MCH, POC: 28.4 pg (ref 27–31.2)
MCHC: 33.6 g/dL (ref 31.8–35.4)
MCV: 84.6 fL (ref 80–97)
MID (cbc): 0.4 (ref 0–0.9)
MPV: 7.2 fL (ref 0–99.8)
POC Granulocyte: 2.7 (ref 2–6.9)
POC LYMPH PERCENT: 36.5 %L (ref 10–50)
POC MID %: 8.6 %M (ref 0–12)
Platelet Count, POC: 179 10*3/uL (ref 142–424)
RBC: 4.46 M/uL (ref 4.04–5.48)
RDW, POC: 16.2 %
WBC: 5 10*3/uL (ref 4.6–10.2)

## 2015-06-25 LAB — TROPONIN I: Troponin I: <0.01 ng/mL (ref <0.06)

## 2015-06-25 LAB — POCT RAPID STREP A (OFFICE): Rapid Strep A Screen: NEGATIVE

## 2015-06-25 MED ORDER — HYDROCODONE-HOMATROPINE 5-1.5 MG/5ML PO SYRP: 5.0000 mL | ORAL_SOLUTION | Freq: Every evening | ORAL | Status: DC | PRN

## 2015-06-25 MED ORDER — AZITHROMYCIN 250 MG PO TABS: ORAL_TABLET | ORAL | Status: DC

## 2015-06-25 MED ORDER — BENZONATATE 100 MG PO CAPS: 200.0000 mg | ORAL_CAPSULE | Freq: Two times a day (BID) | ORAL | Status: DC | PRN

## 2015-06-25 NOTE — Patient Instructions (Signed)
Acute Bronchitis Bronchitis is inflammation of the airways that extend from the windpipe into the lungs (bronchi). The inflammation often causes mucus to develop. This leads to a cough, which is the most common symptom of bronchitis.  In acute bronchitis, the condition usually develops suddenly and goes away over time, usually in a couple weeks. Smoking, allergies, and asthma can make bronchitis worse. Repeated episodes of bronchitis may cause further lung problems.  CAUSES Acute bronchitis is most often caused by the same virus that causes a cold. The virus can spread from person to person (contagious) through coughing, sneezing, and touching contaminated objects. SIGNS AND SYMPTOMS   Cough.   Fever.   Coughing up mucus.   Body aches.   Chest congestion.   Chills.   Shortness of breath.   Sore throat.  DIAGNOSIS  Acute bronchitis is usually diagnosed through a physical exam. Your health care provider will also ask you questions about your medical history. Tests, such as chest X-rays, are sometimes done to rule out other conditions.  TREATMENT  Acute bronchitis usually goes away in a couple weeks. Oftentimes, no medical treatment is necessary. Medicines are sometimes given for relief of fever or cough. Antibiotic medicines are usually not needed but may be prescribed in certain situations. In some cases, an inhaler may be recommended to help reduce shortness of breath and control the cough. A cool mist vaporizer may also be used to help thin bronchial secretions and make it easier to clear the chest.  HOME CARE INSTRUCTIONS  Get plenty of rest.   Drink enough fluids to keep your urine clear or pale yellow (unless you have a medical condition that requires fluid restriction). Increasing fluids may help thin your respiratory secretions (sputum) and reduce chest congestion, and it will prevent dehydration.   Take medicines only as directed by your health care provider.  If  you were prescribed an antibiotic medicine, finish it all even if you start to feel better.  Avoid smoking and secondhand smoke. Exposure to cigarette smoke or irritating chemicals will make bronchitis worse. If you are a smoker, consider using nicotine gum or skin patches to help control withdrawal symptoms. Quitting smoking will help your lungs heal faster.   Reduce the chances of another bout of acute bronchitis by washing your hands frequently, avoiding people with cold symptoms, and trying not to touch your hands to your mouth, nose, or eyes.   Keep all follow-up visits as directed by your health care provider.  SEEK MEDICAL CARE IF: Your symptoms do not improve after 1 week of treatment.  SEEK IMMEDIATE MEDICAL CARE IF:  You develop an increased fever or chills.   You have chest pain.   You have severe shortness of breath.  You have bloody sputum.   You develop dehydration.  You faint or repeatedly feel like you are going to pass out.  You develop repeated vomiting.  You develop a severe headache. MAKE SURE YOU:   Understand these instructions.  Will watch your condition.  Will get help right away if you are not doing well or get worse.   This information is not intended to replace advice given to you by your health care provider. Make sure you discuss any questions you have with your health care provider.   Document Released: 06/20/2004 Document Revised: 06/03/2014 Document Reviewed: 11/03/2012 Elsevier Interactive Patient Education 2016 [employer]  

## 2015-06-25 NOTE — Progress Notes (Signed)
Chief Complaint: No chief complaint on file.   HPI: Sheila Benson is a 55 y.o. female who reports to [facility] today complaining of cough assocaited with nausea since last Wednesay, 5 day hx of sxs , joint pain and also SOB and midepigastric chest pain radiating to back, had fever of 100.6, took theraflu, took nyquil and also Aleve without releif. She can't take a good deep breath. No abd pain. No ear pain no sinus pain. + some sinus congestion.  Low/no risk factors of PE/DVT. Denies recent trauma/surgeires, hx of DVT/PE, long car or plane rides, sedentary lifestyle, OCP use, or malignancy Risk factors for heart disease is HTN , some minimal hyperlipidemia no current meds She works in [employer]  Past Medical History  Diagnosis Date  . Hypertension    Past Surgical History  Procedure Laterality Date  . Appendectomy  8/13    ruptured, long hospital stay  . Carpal tunnel release Left    Social History   Social History  . Marital Status: Single    Spouse Name: N/A  . Number of Children: N/A  . Years of Education: N/A   Social History Main Topics  . Smoking status: Never Smoker   . Smokeless tobacco: Never Used  . Alcohol Use: None  . Drug Use: None  . Sexual Activity: Not Asked   Other Topics Concern  . None   Social History Narrative   Family History  Problem Relation Age of Onset  . Diabetes Mother   . Hypertension Father   . Diabetes Sister   . Diabetes Brother   . Diabetes Brother   . Diabetes Sister    No Known Allergies Prior to Admission medications   Medication Sig Start Date End Date Taking? Authorizing Provider  losartan-hydrochlorothiazide (HYZAAR) 100-12.5 MG tablet Take 1 tablet by mouth daily. 05/04/15   Vickie Ponds P Taneil Lazarus, DO  metoprolol (LOPRESSOR) 50 MG tablet TAKE 1 TABLET (50 MG TOTAL) BY MOUTH 2 (TWO) TIMES DAILY. 05/04/15   Demiana Crumbley P Lorenda Grecco, DO  triamcinolone cream (KENALOG) 0.1 % Apply 1 application topically 2 (two) times daily. For 2 weeks at a time prn  itching. Call if no improvement 05/04/15   Larcenia Holaday P Neeta Storey, DO     ROS: The patient denies fevers, chills, night sweats, unintentional weight loss,palpitations, wheezing, dyspnea on exertion, nausea, vomiting, abdominal pain, dysuria, hematuria, melena, numbness, weakness, or tingling.   All other systems have been reviewed and were otherwise negative with the exception of those mentioned in the HPI and as above.    PHYSICAL EXAM: Filed Vitals:   06/25/15 1123  BP: 130/80  Pulse: 87  Temp: 99.3 F (37.4 C)  Resp: 14   Body mass index is 40.7 kg/(m^2).  BP Readings from Last 3 Encounters:  06/25/15 130/80  05/04/15 122/80  10/31/14 120/74   SpO2 Readings from Last 3 Encounters:  06/25/15 96%  05/04/15 98%  10/31/14 97%     General: Alert, minimal acute distress HEENT:  Normocephalic, atraumatic, oropharynx patent. EOMI, PERRLA Erythematous throat, no exudates, TM normal, +/- sinus tenderness, + erythematous/boggy nasal mucosa Cardiovascular:  Regular rate and rhythm, no rubs murmurs or gallops.  No Carotid bruits, radial pulse intact. No pedal edema.  Respiratory: Clear to auscultation bilaterally.  No wheezes, rales, or rhonchi.  No cyanosis, no use of accessory musculature Abdominal: No organomegaly, abdomen is soft and non-tender, positive bowel sounds. No masses. Skin: No rashes. Neurologic: Facial musculature symmetric. Psychiatric: Patient acts appropriately  throughout our interaction. Lymphatic: No cervical or submandibular lymphadenopathy Musculoskeletal: Gait intact. No edema, tenderness   LABS: Results for orders placed or performed in visit on 06/25/15  Culture, Group A Strep  Result Value Ref Range   Organism ID, Bacteria Normal Upper Respiratory Flora    Organism ID, Bacteria No Beta Hemolytic Streptococci Isolated   Troponin I  Result Value Ref Range   Troponin I <0.01 <0.06 ng/mL  POCT rapid strep A  Result Value Ref Range   Rapid Strep A Screen Negative  Negative  POCT CBC  Result Value Ref Range   WBC 5.0 4.6 - 10.2 K/uL   Lymph, poc 1.8 0.6 - 3.4   POC LYMPH PERCENT 36.5 10 - 50 %L   MID (cbc) 0.4 0 - 0.9   POC MID % 8.6 0 - 12 %M   POC Granulocyte 2.7 2 - 6.9   Granulocyte percent 54.9 37 - 80 %G   RBC 4.46 4.04 - 5.48 M/uL   Hemoglobin 12.7 12.2 - 16.2 g/dL   HCT, POC 16.1 09.6 - 47.9 %   MCV 84.6 80 - 97 fL   MCH, POC 28.4 27 - 31.2 pg   MCHC 33.6 31.8 - 35.4 g/dL   RDW, POC 04.5 %   Platelet Count, POC 179 142 - 424 K/uL   MPV 7.2 0 - 99.8 fL     EKG/XRAY:   Primary read interpreted by Dr. Conley Rolls at [facility]. ? Right lower lobe early infiltrate  ASSESSMENT/PLAN: Encounter Diagnoses  Name Primary?  . Chest pain, unspecified chest pain type   . SOB (shortness of breath)   . Cough   . Fever, unspecified   . Lower respiratory infection (e.g., bronchitis, pneumonia, pneumonitis, pulmonitis) Yes  . Sinus congestion    Possible viral flu like sx sbut no flu treatment sicne outside window, she also has bronchitic sxs so will treat as if may have bronchitis Rx Azithromycin, tessalon and hycodan CBC is wnl, Will get Troponin I stat , EKG and chest xray reassuring Strep cx pending Work note given , advise to go to ER prn  Labs given to patient from prior  Gross sideeffects, risk and benefits, and alternatives of medications d/w patient. Patient is aware that all medications have potential sideeffects and we are unable to predict every sideeffect or drug-drug interaction that may occur.  Havyn Ramo DO  06/25/2015 3:53 PM   Call ed patient that troponin negative  CLINICAL DATA: Chest pain, shortness of breath, cough, fever.  EXAM: CHEST 2 VIEW  COMPARISON: 09/06/2010  FINDINGS: Heart and mediastinal contours are within normal limits. No focal opacities or effusions. No acute bony abnormality.  IMPRESSION: No active cardiopulmonary disease.   Electronically Signed  By: Charlett Nose M.D.  On: 06/25/2015  11:11

## 2015-06-27 LAB — CULTURE, GROUP A STREP: Organism ID, Bacteria: NORMAL

## 2015-06-28 ENCOUNTER — Ambulatory Visit (INDEPENDENT_AMBULATORY_CARE_PROVIDER_SITE_OTHER): Payer: Managed Care, Other (non HMO) | Admitting: Family Medicine

## 2015-06-28 VITALS — BP 130/85 | HR 78 | Temp 98.6°F | Resp 15 | Ht 64.0 in | Wt 239.0 lb

## 2015-06-28 DIAGNOSIS — R0981 Nasal congestion: Secondary | ICD-10-CM | POA: Diagnosis not present

## 2015-06-28 DIAGNOSIS — R0781 Pleurodynia: Secondary | ICD-10-CM | POA: Diagnosis not present

## 2015-06-28 DIAGNOSIS — R059 Cough, unspecified: Secondary | ICD-10-CM

## 2015-06-28 DIAGNOSIS — J988 Other specified respiratory disorders: Secondary | ICD-10-CM

## 2015-06-28 DIAGNOSIS — R05 Cough: Secondary | ICD-10-CM

## 2015-06-28 DIAGNOSIS — J22 Unspecified acute lower respiratory infection: Secondary | ICD-10-CM

## 2015-06-28 MED ORDER — ALBUTEROL SULFATE (2.5 MG/3ML) 0.083% IN NEBU
2.5000 mg | INHALATION_SOLUTION | Freq: Once | RESPIRATORY_TRACT | Status: AC
  Administered 2015-06-28: 2.5 mg via RESPIRATORY_TRACT

## 2015-06-28 MED ORDER — IPRATROPIUM BROMIDE 0.02 % IN SOLN
0.5000 mg | Freq: Once | RESPIRATORY_TRACT | Status: AC
  Administered 2015-06-28: 0.5 mg via RESPIRATORY_TRACT

## 2015-06-28 MED ORDER — ALBUTEROL SULFATE HFA 108 (90 BASE) MCG/ACT IN AERS: 2.0000 | INHALATION_SPRAY | Freq: Four times a day (QID) | RESPIRATORY_TRACT | Status: DC | PRN

## 2015-06-28 NOTE — Progress Notes (Signed)
 Chief Complaint:  Chief Complaint  Patient presents with  . Follow-up    URI, chest pain    HPI: Sheila Benson is a 55 y.o. female who reports to [facility] today complaining of here for recheck of lower respiratory sxs. She has been on z pack and also cough meds She is not feeling better. I suspect she had the flu and now is having respiratory sxs . 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 Some wheezing still present with lots of coughing.   Chest xray was normal on last visit:  CLINICAL DATA: Chest pain, shortness of breath, cough, fever.  EXAM: CHEST 2 VIEW  COMPARISON: 09/06/2010  FINDINGS: Heart and mediastinal contours are within normal limits. No focal opacities or effusions. No acute bony abnormality.  IMPRESSION: No active cardiopulmonary disease.   Electronically Signed  By: Charlett Nose M.D.  On: 06/25/2015 11:11  Past Medical History  Diagnosis Date  . Hypertension    Past Surgical History  Procedure Laterality Date  . Appendectomy  8/13    ruptured, long hospital stay  . Carpal tunnel release ft    Social History   Social History  . Marital Status: Single    Spouse Name: N/A  . Number of Children: N/A  . Years of Education: N/A   Social History Main Topics  . Smoking status: Never Smoker   . Smokeless tobacco: Never Used  . Alcohol Use: None  . Drug Use: None  . Sexual Activity: Not Asked   Other Topics Concern  . None   Social History Narrative   Family History  Problem Relation Age of Onset  . Diabetes Mother   . Hypertension Father   . Diabetes Sister   . Diabetes Brother   . Diabetes Brother   . Diabetes Sister    No Known Allergies Prior to Admission medications   Medication Sig Start Date End Date Taking? Authorizing Provider  azithromycin (ZITHROMAX) 250 MG tablet Take 2 tabs po now thnext 4 days en 1 tab po daily for the 06/25/15  Yes  P , DO  benzonatate (TESSALON) 100 MG capsule Take 2 capsules (200  mg total) by mouth 2 (two) times daily as needed. 06/25/15  Yes  P , DO  HYDROcodone-homatropine (HYCODAN) 5-1.5 MG/5ML syrup Take 5 mLs by mouth at bedtime as needed. 06/25/15  Yes  P , DO  losartan-hydrochlorothiazide (HYZAAR) 100-12.5 MG tablet Take 1 tablet by mouth daily. 05/04/15  Yes  P , DO  metoprolol (LOPRESSOR) 50 MG tablet TAKE 1 TABLET (50 MG TOTAL) BY MOUTH 2 (TWO) TIMES DAILY. 05/04/15  Yes  P , DO  triamcinolone cream (KENALOG) 0.1 % Apply 1 application topically 2 (two) times daily. For 2 weeks at a time prn itching. Call if no improvement 05/04/15  Yes  P , DO     ROS: The patient denies fevers, chills, night sweats, unintentional weight loss, nausea, vomiting, abdominal pain, dysuria, hematuria, melena, numbness, weakness, or tingling.   All other systems have been reviewed and were otherwise negative with the exception of those mentioned in the HPI and as above.    PHYSICAL EXAM: Filed Vitals:   06/28/15 1622  BP: 130/85  Pulse: 78  Temp: 98.6 F (37 C)  Resp: 15   SpO2 Readings from Last 3 Encounters:  06/28/15 97%  06/25/15 96%  05/04/15 98%    Body mass index is 41 kg/(m^2).   General: Alert, no acute distress  HEENT:  Normocephalic, atraumatic, oropharynx patent. EOMI, PERRLA Tm normal, no exudates, erythematous throat.  Cardiovascular:  Regular rate and rhythm, no rubs murmurs or gallops.  No Carotid bruits, radial pulse intact. No pedal edema.  Respiratory: Clear to auscultation bilaterally.  No wheezes, rales, or rhonchi.  No cyanosis, no use of accessory musculature Abdominal: No organomegaly, abdomen is soft and non-tender, positive bowel sounds. No masses. Skin: No rashes. Neurologic: Facial musculature symmetric. Psychiatric: Patient acts appropriately throughout our interaction. Lymphatic: No cervical or submandibular lymphadenopathy Musculoskeletal: Gait intact. No edema, tenderness   LABS: Results for orders placed  or performed in visit on 06/25/15  Culture, Group A Strep  Result Value Ref Range   Organism ID, Bacteria Normal Upper Respiratory Flora    Organism ID, Bacteria No Beta Hemolytic Streptococci Isolated   Troponin I  Result Value Ref Range   Troponin I <0.01 <0.06 ng/mL  POCT rapid strep A  Result Value Ref Range   Rapid Strep A Screen Negative Negative  POCT CBC  Result Value Ref Range   WBC 5.0 4.6 - 10.2 K/uL   Lymph, poc 1.8 0.6 - 3.4   POC LYMPH PERCENT 36.5 10 - 50 %L   MID (cbc) 0.4 0 - 0.9   POC MID % 8.6 0 - 12 %M   POC Granulocyte 2.7 2 - 6.9   Granulocyte percent 54.9 37 - 80 %G   RBC 4.46 4.04 - 5.48 M/uL   Hemoglobin 12.7 12.2 - 16.2 g/dL   HCT, POC 16.1 09.6 - 47.9 %   MCV 84.6 80 - 97 fL   MCH, POC 28.4 27 - 31.2 pg   MCHC 33.6 31.8 - 35.4 g/dL   RDW, POC 04.5 %   Platelet Count, POC 179 142 - 424 K/uL   MPV 7.2 0 - 99.8 fL     EKG/XRAY:   Primary read interpreted by Dr. Conley Rolls at [facility].   ASSESSMENT/PLAN: Encounter Diagnoses  Name Primary?  . Pleuritic chest pain Yes  . Lower respiratory infection (e.g., bronchitis, pneumonia, pneumonitis, pulmonitis)   . Sinus congestion   . Cough    Felt better after nebs, better BS Cont with meds Albuterl inh, cough meds, z pack OOW note Fu prn   Gross sideeffects, risk and benefits, and alternatives of medications d/w patient. Patient is aware that all medications have potential sideeffects and we are unable to predict every sideeffect or drug-drug interaction that may occur.    DO  06/28/2015 8:04 PM

## 2015-07-19 ENCOUNTER — Telehealth: Payer: Self-pay

## 2015-07-19 NOTE — Telephone Encounter (Signed)
Patient needs FMLA forms completed by Dr. Conley Rolls, I was not sure exactly why she needs these forms completed I thought she might have discussed it with you it appears to be for her Pleuritic chest pain  So I filled out the paperwork to the best of my ability and highlighted the areas that need to be completed. Please fill out and return to the FMLA/Disability box at the 102 checkout desk with in 5-7 business days. i will place in your box on 07/19/15.

## 2015-07-21 DIAGNOSIS — Z0271 Encounter for disability determination: Secondary | ICD-10-CM

## 2015-07-26 NOTE — Telephone Encounter (Signed)
Sending today.

## 2015-07-28 NOTE — Telephone Encounter (Signed)
Forms scanned and faxed to [facility] on 07/28/15

## 2015-10-27 ENCOUNTER — Other Ambulatory Visit: Payer: Self-pay

## 2015-10-27 MED ORDER — METOPROLOL TARTRATE 50 MG PO TABS: ORAL_TABLET | ORAL | Status: DC

## 2015-10-27 MED ORDER — LOSARTAN POTASSIUM-HCTZ 100-12.5 MG PO TABS: 1.0000 | ORAL_TABLET | Freq: Every day | ORAL | Status: AC

## 2016-02-07 ENCOUNTER — Ambulatory Visit: Payer: Managed Care, Other (non HMO)

## 2016-02-13 ENCOUNTER — Ambulatory Visit: Payer: Managed Care, Other (non HMO)

## 2016-02-14 ENCOUNTER — Ambulatory Visit: Payer: Managed Care, Other (non HMO)

## 2016-03-28 ENCOUNTER — Other Ambulatory Visit: Payer: Self-pay | Admitting: Internal Medicine

## 2016-03-28 DIAGNOSIS — Z1231 Encounter for screening mammogram for malignant neoplasm of breast: Secondary | ICD-10-CM

## 2016-04-15 ENCOUNTER — Ambulatory Visit: Payer: Managed Care, Other (non HMO)

## 2017-02-16 IMAGING — CR DG CHEST 2V
2 series · 2 of 2 positions shown · non-contrast
Comparison: 09/06/2010

CLINICAL DATA: Chest pain, shortness of breath, cough, fever.

EXAM:
CHEST  2 VIEW

[PA]
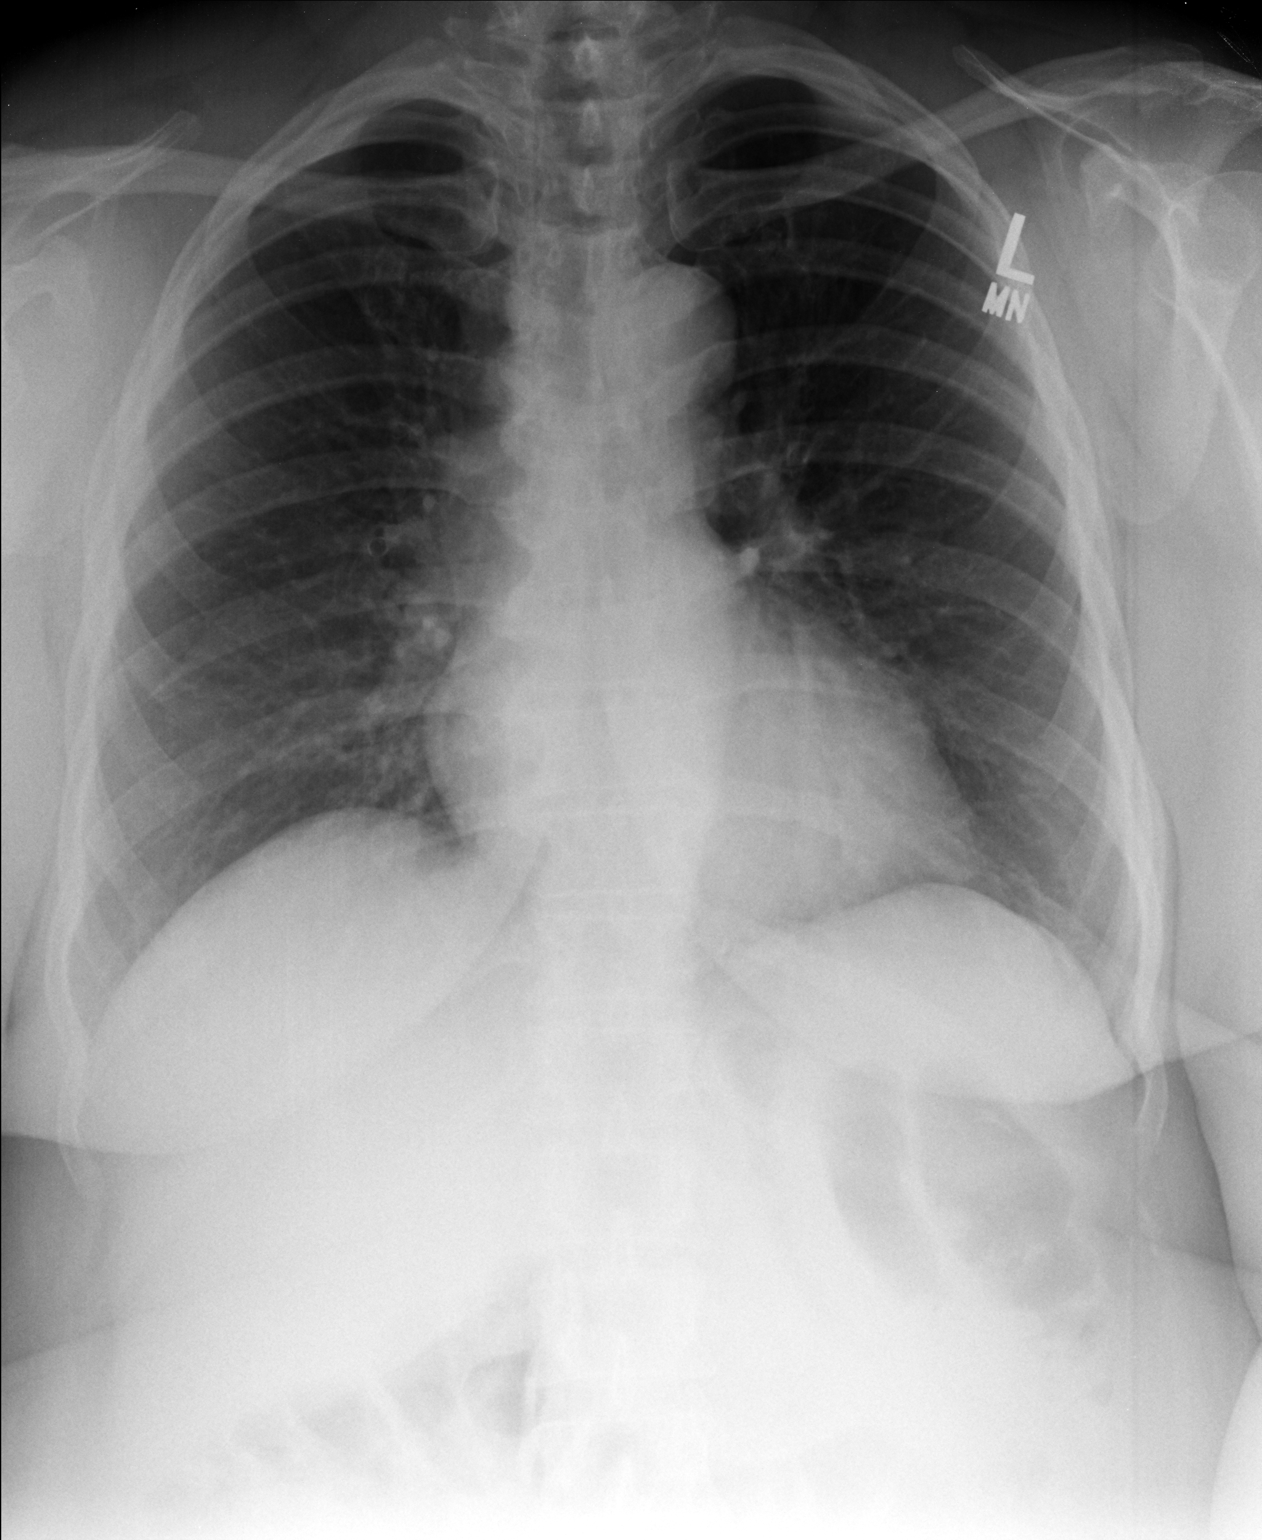

[lateral]
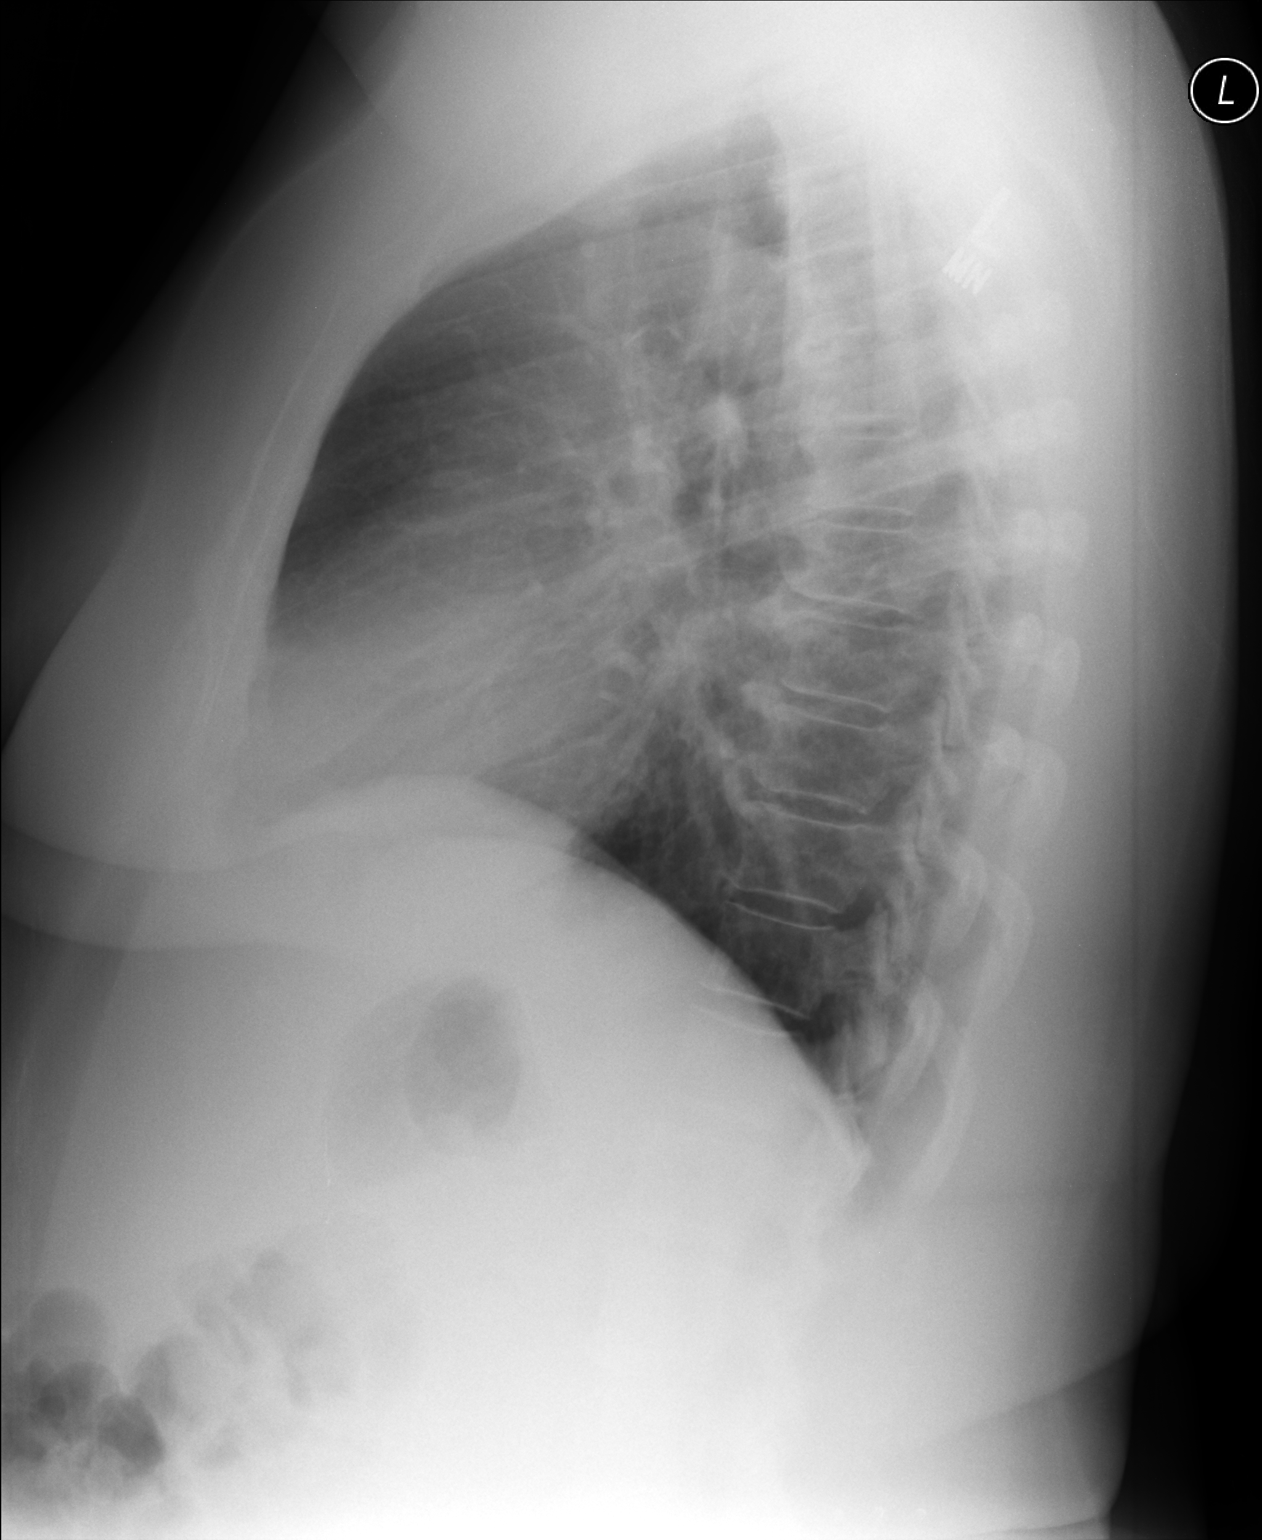

[2 of 2 positions shown; findings below may reference images not displayed]

FINDINGS: Heart and mediastinal contours are within normal limits. No focal
opacities or effusions. No acute bony abnormality.
IMPRESSION: No active cardiopulmonary disease.

## 2017-03-10 ENCOUNTER — Other Ambulatory Visit: Payer: Self-pay | Admitting: Physician Assistant

## 2017-03-12 NOTE — Telephone Encounter (Signed)
Refill request for metoprolol 50 mg denied.  Pt received #180 tabs(90 day supply) with 1 refill on 10/27/15. No refill is needed.

## 2017-03-15 ENCOUNTER — Other Ambulatory Visit: Payer: Self-pay | Admitting: Physician Assistant

## 2017-03-18 ENCOUNTER — Telehealth: Payer: Self-pay | Admitting: Physician Assistant

## 2017-03-18 NOTE — Telephone Encounter (Signed)
[facility] is calling to check on status of a refill request for   Losartan and also metoprolol she is completely out  And has called [facility] several times checking on status   Best number [phone]  Fax number [phone]

## 2017-03-20 NOTE — Telephone Encounter (Signed)
IC [facility], advised meds refused.  Last visit for HTN 04/2015.  Needs appt.

## 2017-05-01 ENCOUNTER — Other Ambulatory Visit (HOSPITAL_COMMUNITY): Payer: Self-pay | Admitting: General Surgery

## 2017-05-14 ENCOUNTER — Other Ambulatory Visit: Payer: Self-pay

## 2017-05-14 ENCOUNTER — Ambulatory Visit (HOSPITAL_COMMUNITY)
Admission: RE | Admit: 2017-05-14 | Discharge: 2017-05-14 | Disposition: A | Discharge status: Home / Self Care | Payer: Managed Care, Other (non HMO) | Source: Ambulatory Visit | Attending: General Surgery | Admitting: General Surgery

## 2017-05-28 ENCOUNTER — Encounter: Payer: Self-pay | Admitting: Registered"

## 2017-05-28 ENCOUNTER — Encounter: Payer: Managed Care, Other (non HMO) | Attending: General Surgery | Admitting: Registered"

## 2017-05-28 DIAGNOSIS — Z713 Dietary counseling and surveillance: Secondary | ICD-10-CM | POA: Insufficient documentation

## 2017-05-28 DIAGNOSIS — E669 Obesity, unspecified: Secondary | ICD-10-CM

## 2017-05-28 NOTE — Progress Notes (Signed)
Pre-Op Assessment Visit:  Pre-Operative Sleeve gastrectomy Surgery  Medical Nutrition Therapy:  Appt start time: 2:00  End time:  3:00  Patient was seen on 05/28/2017 for Pre-Operative Nutrition Assessment. Assessment and letter of approval [facility] coordinator on 05/28/2017.   Pt expectation of surgery: improve quality of life, to be around for family (children and grandchildren)  Pt expectation of Dietitian: educate what to eat/not to eat to be successful  Start weight at [facility]: 248.9 BMI: 43.40   Pt states she has family history of diabetes. Pt states she had son at age 57 and wants to see him graduate from high school. 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 Pt states she tastes a lot of desserts at work; has a sweet tooth. Pt states she walks a lot at work, works long hours, and tired when she gets off work. Pt states she makes easy dinner options. Pt states she rarely drinks soda, does not like plain water, drinks hot drinks in the mornings only and does not drink much juice due to acid reflux.   Per insurance, pt needs 3 SWL visits prior to surgery.    24 hr Dietary Recall: First Meal: oats (similar to porridge), sandwich (eggs or cheese with white bread) Snack: none Second Meal: skips; sometimes leftovers Snack: none Third Meal: rice, peas, stew chicken or salmon, beans or steak, potatoes Snack: none Beverages: water w/ flavored packs, hot chocolate, coffee (rarely)   Encouraged to engage in 75 minutes of moderate physical activity including cardiovascular and weight baring weekly  Handouts given during visit include:  . Pre-Op Goals . Bariatric Surgery Protein Shakes . Vitamin and Mineral Recommendations  During the appointment today the following Pre-Op Goals were reviewed with the patient: . Track your food and beverage: MyFitness Pal or Baritastic App . Make healthy food choices . Begin to  limit portion sizes . Limited concentrated sugars and fried foods . Keep fat/sugar in the single digits per serving on          food labels . Practice CHEWING your food  (aim for 30 chews per bite or until applesauce consistency) . Practice not drinking 15 minutes before, during, and 30 minutes after each meal/snack . Avoid all carbonated beverages  . Avoid/limit caffeinated beverages  . Avoid all sugar-sweetened beverages . Avoid alcohol . Consume 3 meals per day; eat every 3-5 hours . Make a list of non-food related activities . Aim for 64-100 ounces of FLUID daily  . Aim for at least 60-80 grams of PROTEIN daily . Look for a liquid protein source that contain ?15 g protein and ?5 g carbohydrate  (ex: shakes, drinks, shots) . Physical activity is an important part of a healthy lifestyle so keep it moving!  Follow diet recommendations listed below Energy and Macronutrient Recommendations: Calories: 1600 Carbohydrate: 180 Protein: 120 Fat: 44  Demonstrated degree of understanding via:  Teach Back   Teaching Method Utilized:  Visual Auditory Hands on  Barriers to learning/adherence to lifestyle change: work-life balance  Patient to call the Nutrition and Diabetes Education Services to enroll in Pre-Op and Post-Op Nutrition Education when surgery date is scheduled.

## 2017-07-02 ENCOUNTER — Encounter: Payer: Managed Care, Other (non HMO) | Attending: General Surgery | Admitting: Registered"

## 2017-07-02 ENCOUNTER — Encounter: Payer: Self-pay | Admitting: Registered"

## 2017-07-02 DIAGNOSIS — Z713 Dietary counseling and surveillance: Secondary | ICD-10-CM | POA: Insufficient documentation

## 2017-07-02 DIAGNOSIS — E669 Obesity, unspecified: Secondary | ICD-10-CM

## 2017-07-02 NOTE — Progress Notes (Signed)
Sleeve Gastrectomy Appt start time: 2:00 end time: 2:18  Assessment: 1st SWL Appointment.   Start Wt at [facility]: 248.9 Wt: 245.6 BMI: 42.82   Pt arrives having lost 3.3 lbs from previous visit.   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 Pt states she likes to have something hot in the morning, typically a few sips of coffee. Pt states she has been doing well with not drinking during mealtimes. Pt states she has also eliminated juice, soda, alcohol, and wine. Pt states she likes vanilla and caramel premier protein; can tolerate chocolate flavor. Pt states she has also been working on  trying to chew at least 30 times per bite. Pt states she always eats breakfast in the car while driving to work. Pt states she likes to have a goal.   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    Intervention:  Nutrition counseling for upcoming Bariatric Surgery.  Goals:  - Aim for 150 minutes of physical activity including cardio and weight bearing every week - Aim to eat 3 meals a day.  - Aim for at least 64 ounces of fluid a day. Track  it.  - Continue with habits already in place.   Teaching Method Utilized:  Visual Auditory Hands on  Handouts given during visit include:  none  Barriers to learning/adherence to lifestyle change: work-life balance  Demonstrated degree of understanding via:  Teach Back   Monitoring/Evaluation:  Dietary intake, exercise, and body weight in 1 month(s).

## 2017-07-02 NOTE — Patient Instructions (Addendum)
-   Aim to eat 3 meals a day.   - Aim for at least 64 ounces of fluid a day. Track it.   - Continue with habits already in place.

## 2017-07-09 ENCOUNTER — Ambulatory Visit (INDEPENDENT_AMBULATORY_CARE_PROVIDER_SITE_OTHER): Payer: Managed Care, Other (non HMO) | Admitting: Psychiatry

## 2017-07-09 DIAGNOSIS — F509 Eating disorder, unspecified: Secondary | ICD-10-CM | POA: Diagnosis not present

## 2017-07-11 ENCOUNTER — Other Ambulatory Visit: Payer: Self-pay | Admitting: Internal Medicine

## 2017-07-11 ENCOUNTER — Other Ambulatory Visit (HOSPITAL_COMMUNITY)
Admission: RE | Admit: 2017-07-11 | Discharge: 2017-07-11 | Disposition: A | Discharge status: Home / Self Care | Payer: Managed Care, Other (non HMO) | Source: Ambulatory Visit | Attending: Internal Medicine | Admitting: Internal Medicine

## 2017-07-11 DIAGNOSIS — Z124 Encounter for screening for malignant neoplasm of cervix: Secondary | ICD-10-CM | POA: Diagnosis not present

## 2017-07-11 DIAGNOSIS — Z1231 Encounter for screening mammogram for malignant neoplasm of breast: Secondary | ICD-10-CM

## 2017-07-15 LAB — CYTOLOGY - PAP: Diagnosis: NEGATIVE

## 2017-07-30 ENCOUNTER — Encounter: Payer: Self-pay | Admitting: Registered"

## 2017-07-30 ENCOUNTER — Encounter: Payer: Managed Care, Other (non HMO) | Attending: General Surgery | Admitting: Registered"

## 2017-07-30 DIAGNOSIS — Z713 Dietary counseling and surveillance: Secondary | ICD-10-CM | POA: Diagnosis not present

## 2017-07-30 DIAGNOSIS — E669 Obesity, unspecified: Secondary | ICD-10-CM

## 2017-07-30 NOTE — Progress Notes (Signed)
Sleeve Gastrectomy Appt start time: 2:00 end time: 2:30  Assessment: 2nd SWL Appointment.   Start Wt at [facility]: 248.9 Wt: 246.4 BMI: 42.96   Pt arrives having maintained weight from previous visit.   Pt states she was diagnosed with type 2 diabetes 2 months ago and didn't know it until 2 weeks ago when questioned at annual checkup about taking diabetic medications. Pt states she has a family history of diabetes. Pt states she does not check her blood sugars and plans to get her supplies from [facility]. Pt states she is from [place]. Pt states she wants to live long enough to see her 56 year old son graduate from high school, get married, and have children. Pt states she realizes she needs to make more time for herself. Pt states she is doing well with chewing 30 times per bite. Pt states she loves vegetables especially tomatoes. Pt states she has started consistently eating 3 meals a day.   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 Pt states she has been doing well with not drinking during mealtimes. Pt states she has also eliminated juice, soda, alcohol, and wine. Pt states she likes vanilla and caramel premier protein; can tolerate chocolate flavor. Pt states she always eats breakfast in the car while driving to work. Pt states she likes to have a goal.   Per insurance, pt needs 3 SWL visits prior to surgery.  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    Intervention:  Nutrition counseling for upcoming Bariatric Surgery.  Goals:  - Aim for 150 minutes of physical activity including cardio and weight bearing every week - Aim to have healthy snacks during the day, such as fruit and protein source.  - Aim to mindfully eat. Snacks and meals away from the bakery.  - Increase non-starchy vegetable intake.    Teaching Method Utilized:  Visual Auditory Hands on  Handouts given during visit include:  none  Barriers to learning/adherence to lifestyle change: work-life balance  Demonstrated degree of understanding via:  Teach Back   Monitoring/Evaluation:  Dietary intake, exercise, and body weight in 1 month(s).

## 2017-07-30 NOTE — Patient Instructions (Addendum)
-   Aim to have healthy snacks during the day, such as fruit and protein source.   - Aim to mindfully eat. Snacks and meals away from the bakery.   - Increase non-starchy vegetable intake.

## 2017-08-06 ENCOUNTER — Ambulatory Visit
Admission: RE | Admit: 2017-08-06 | Discharge: 2017-08-06 | Disposition: A | Discharge status: Home / Self Care | Payer: Managed Care, Other (non HMO) | Source: Ambulatory Visit | Attending: Internal Medicine | Admitting: Internal Medicine

## 2017-08-06 DIAGNOSIS — Z1231 Encounter for screening mammogram for malignant neoplasm of breast: Secondary | ICD-10-CM

## 2017-08-13 ENCOUNTER — Ambulatory Visit (INDEPENDENT_AMBULATORY_CARE_PROVIDER_SITE_OTHER): Payer: Managed Care, Other (non HMO) | Admitting: Psychiatry

## 2017-08-13 DIAGNOSIS — F509 Eating disorder, unspecified: Secondary | ICD-10-CM | POA: Diagnosis not present

## 2017-08-26 ENCOUNTER — Encounter: Payer: Self-pay | Admitting: Physician Assistant

## 2017-08-27 ENCOUNTER — Encounter: Payer: Managed Care, Other (non HMO) | Attending: General Surgery | Admitting: Registered"

## 2017-08-27 ENCOUNTER — Encounter: Payer: Self-pay | Admitting: Registered"

## 2017-08-27 DIAGNOSIS — Z713 Dietary counseling and surveillance: Secondary | ICD-10-CM | POA: Diagnosis not present

## 2017-08-27 DIAGNOSIS — E669 Obesity, unspecified: Secondary | ICD-10-CM

## 2017-08-27 NOTE — Progress Notes (Signed)
Sleeve Gastrectomy Appt start time: 2:00 end time: 2:30  Assessment: 3rd SWL Appointment.   Start Wt at [facility]: 248.9 Wt: 245.2 BMI: 42.75   Pt arrives having lost 1.2 lbs from previous visit.   Pt states she has not checked BS but avoids sugary food items; taking medications as prescribed. Pt states she was not told to check BS regularly. Pt states she has started eating healthy snacks. Pt states she has been taking breaks at work to eat. Pt states she has increased vegetable intake with every meal. Pt states she has been doing well not drinking 15 minutes before eating, not while eating, and waiting 30 minutes after. Pt states she wants to be successful with surgery and working hard towards it.   Pt states she was diagnosed with type 2 diabetes 2 months ago and didn't know it until 2 weeks ago when questioned at annual checkup about taking diabetic medications. Pt states she has a family history of diabetes. Pt states she does not check her blood sugars and plans to get her supplies from [employer]. Pt states she is from [place]. Pt states she wants to live long enough to see her 56 year old son graduate from high school, get married, and have children. Pt states she is doing well with chewing 30 times per bite. Pt states she loves vegetables especially tomatoes. Pt states she has started consistently eating 3 meals a day.   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 Pt states she has been doing well with not drinking during mealtimes. Pt states she has also eliminated juice, soda, alcohol, and wine. Pt states she likes vanilla and caramel premier protein; can tolerate chocolate flavor. Pt states she always eats breakfast in the car while driving to work. Pt states she likes to have a goal.   Per insurance, pt needs 3 SWL visits prior to surgery.  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    Intervention:  Nutrition counseling for upcoming Bariatric Surgery.  Goals:  - Aim for 150 minutes of physical activity including cardio and weight bearing every week - Increase fluid intake to at least 64 ounces a day.  - Keep up the great work with everything!  Teaching Method Utilized:  Visual Auditory Hands on  Handouts given during visit include:  none  Barriers to learning/adherence to lifestyle change: work-life balance  Demonstrated degree of understanding via:  Teach Back   Monitoring/Evaluation:  Dietary intake, exercise, and body weight prn.

## 2017-08-27 NOTE — Patient Instructions (Signed)
-   Increase fluid intake to at least 64 ounces a day.   - Keep up the great work with everything!

## 2017-09-08 ENCOUNTER — Encounter: Payer: Self-pay | Admitting: Skilled Nursing Facility1

## 2017-09-08 ENCOUNTER — Encounter: Payer: Managed Care, Other (non HMO) | Admitting: Skilled Nursing Facility1

## 2017-09-08 DIAGNOSIS — E669 Obesity, unspecified: Secondary | ICD-10-CM

## 2017-09-08 NOTE — Progress Notes (Signed)
Pre-Operative Nutrition Class:  Appt start time: 0938   End time:  1830.  Patient was seen on 09/08/2017 for Pre-Operative Bariatric Surgery Education at [facility].   Surgery date:  Surgery type: sleeve Start weight at [facility]: 248.9 Weight today: 245.5  Samples given per MNT protocol. Patient educated on appropriate usage: Bariatric Advantage Multivitamin Lot # [id number] Exp: 10/20  Bariatric Fusion Calcium   Unjury Protein  Shake Lot # [id number] Exp: oct-3-19  The following the learning objectives were met by the patient during this course:  Identify Pre-Op Dietary Goals and will begin 2 weeks pre-operatively  Identify appropriate sources of fluids and proteins   State protein recommendations and appropriate sources pre and post-operatively  Identify Post-Operative Dietary Goals and will follow for 2 weeks post-operatively  Identify appropriate multivitamin and calcium sources  Describe the need for physical activity post-operatively and will follow MD recommendations  State when to call healthcare provider regarding medication questions or post-operative complications  Handouts given during class include:  Pre-Op Bariatric Surgery Diet Handout  Protein Shake Handout  Post-Op Bariatric Surgery Nutrition Handout  BELT Program Information Flyer  Support Group Information Flyer  [facility] Bariatric Supplements Price List  [facility]: [facility] per MD.

## 2017-10-01 ENCOUNTER — Ambulatory Visit: Payer: Self-pay | Admitting: General Surgery

## 2017-10-02 NOTE — Patient Instructions (Addendum)
Sheila Benson  10/02/2017   Your procedure is scheduled on: 10-13-17  Report to [facility] Main  Entrance              Report to admitting at     0530 AM    Call this number if you have problems the morning of surgery [phone]   Remember: Do not eat food:After  600 pm     Take these medicines the morning of surgery with A SIP OF WATER: metoprolol, atorvastatin, cetrizine                                You may not have any metal on your body including hair pins and              piercings  Do not wear jewelry, make-up, lotions, powders or perfumes, deodorant             Do not wear nail polish.  Do not shave  48 hours prior to surgery.          Do not bring valuables to the hospital. [place] IS NOT             RESPONSIBLE   FOR VALUABLES.  Contacts, dentures or bridgework may not be worn into surgery.  Leave suitcase in the car. After surgery it may be brought to your room.     Patients discharged the day of surgery will not be allowed to drive home.  Name and phone number of your driver:  Special Instructions: N/A              Please read over the following fact sheets you were given: _____________________________________________________________________ DRINK TWO HAKES BY 1000 PM THE NIGHT BEFORE SURGERY MORNING OF SURGERY DRINK:  1SHAKE BEFORE YOU LEAVE HOME, DRINK ALL OF THE SHAKE AT ONE TIME.   NO SOLID FOOD AFTER 600 PM THE NIGHT BEFORE YOUR SURGERY. YOU MAY DRINK CLEAR FLUIDS.   THE SHAKE YOU DRINK BEFORE YOU LEAVE HOME WILL BE THE LAST FLUIDS YOU DRINK BEFORE SURGERY.  PAIN IS EXPECTED AFTER SURGERY AND WILL NOT BE COMPLETELY ELIMINATED. AMBULATION AND TYLENOL WILL HELP REDUCE INCISIONAL AND GAS PAIN. MOVEMENT IS KEY!  YOU ARE EXPECTED TO BE OUT OF BED WITHIN 4 HOURS OF ADMISSION TO YOUR PATIENT ROOM.  SITTING IN THE RECLINER THROUGHOUT THE DAY IS IMPORTANT FOR DRINKING FLUIDS AND MOVING GAS THROUGHOUT THE GI TRACT.  COMPRESSION STOCKINGS  SHOULD BE WORN [facility] STAY UNLESS YOU ARE WALKING.   INCENTIVE SPIROMETER SHOULD BE USED EVERY HOUR WHILE AWAKE TO DECREASE POST-OPERATIVE COMPLICATIONS SUCH AS PNEUMONIA.  WHEN DISCHARGED HOME, IT IS IMPORTANT TO CONTINUE TO WALK EVERY HOUR AND USE THE INCENTIVE SPIROMETER EVERY HOUR.      CLEAR LIQUID DIET   Foods Allowed                                                                     Foods Excluded  Coffee and tea, regular and decaf  liquids that you cannot  Plain Jell-O in any flavor                                             see through such as: Fruit ices (not with fruit pulp)                                     milk, soups, orange juice  Iced Popsicles                                    All solid food Carbonated beverages, regular and diet                                    Cranberry, grape and apple juices Sports drinks like Gatorade Lightly seasoned clear broth or consume(fat free) Sugar, honey syrup  Sample Menu Breakfast                                Lunch                                     Supper Cranberry juice                    Beef broth                            Chicken broth Jell-O                                     Grape juice                           Apple juice Coffee or tea                        Jell-O                                      Popsicle                                                Coffee or tea                        Coffee or tea  _____________________________________________________________________        [facility] - Preparing for Surgery Before surgery, you can play an important role.  Because skin is not sterile, your skin needs to be as free of germs as possible.  You can reduce the number of germs on your skin by washing with CHG (chlorahexidine gluconate) soap before surgery.  CHG is  an antiseptic cleaner which kills germs and bonds with the skin to continue killing germs even  after washing. Please DO NOT use if you have an allergy to CHG or antibacterial soaps.  If your skin becomes reddened/irritated stop using the CHG and inform your nurse when you arrive at Short Stay. Do not shave (including legs and underarms) for at least 48 hours prior to the first CHG shower.  You may shave your face/neck. Please follow these instructions carefully:  1.  Shower with CHG Soap the night before surgery and the  morning of Surgery.  2.  If you choose to wash your hair, wash your hair first as usual with your  normal  shampoo.  3.  After you shampoo, rinse your hair and body thoroughly to remove the  shampoo.                           4.  Use CHG as you would any other liquid soap.  You can apply chg directly  to the skin and wash                       Gently with a scrungie or clean washcloth.  5.  Apply the CHG Soap to your body ONLY FROM THE NECK DOWN.   Do not use on face/ open                           Wound or open sores. Avoid contact with eyes, ears mouth and genitals (private parts).                       Wash face,  Genitals (private parts) with your normal soap.             6.  Wash thoroughly, paying special attention to the area where your surgery  will be performed.  7.  Thoroughly rinse your body with warm water from the neck down.  8.  DO NOT shower/wash with your normal soap after using and rinsing off  the CHG Soap.                9.  Pat yourself dry with a clean towel.            10.  Wear clean pajamas.            11.  Place clean sheets on your bed the night of your first shower and do not  sleep with pets. Day of Surgery : Do not apply any lotions/deodorants the morning of surgery.  Please wear clean clothes to the hospital/surgery center.  FAILURE TO FOLLOW THESE INSTRUCTIONS MAY RESULT IN THE CANCELLATION OF YOUR SURGERY PATIENT SIGNATURE_________________________________  NURSE  SIGNATURE__________________________________  ________________________________________________________________________  WHAT IS A BLOOD TRANSFUSION? Blood Transfusion Information  A transfusion is the replacement of blood or some of its parts. Blood is made up of multiple cells which provide different functions.  Red blood cells carry oxygen and are used for blood loss replacement.  White blood cells fight against infection.  Platelets control bleeding.  Plasma helps clot blood.  Other blood products are available for specialized needs, such as hemophilia or other clotting disorders. BEFORE THE TRANSFUSION  Who gives blood for transfusions?   Healthy volunteers who are fully evaluated to make sure their blood is safe. This is blood bank blood. Transfusion therapy is the  safest it has ever been in the practice of medicine. Before blood is taken from a donor, a complete history is taken to make sure that person has no history of diseases nor engages in risky social behavior (examples are intravenous drug use or sexual activity with multiple partners). The donor's travel history is screened to minimize risk of transmitting infections, such as malaria. The donated blood is tested for signs of infectious diseases, such as HIV and hepatitis. The blood is then tested to be sure it is compatible with you in order to minimize the chance of a transfusion reaction. If you or a relative donates blood, this is often done in anticipation of surgery and is not appropriate for emergency situations. It takes many days to process the donated blood. RISKS AND COMPLICATIONS Although transfusion therapy is very safe and saves many lives, the main dangers of transfusion include:   Getting an infectious disease.  Developing a transfusion reaction. This is an allergic reaction to something in the blood you were given. Every precaution is taken to prevent this. The decision to have a blood transfusion has been  considered carefully by your caregiver before blood is given. Blood is not given unless the benefits outweigh the risks. AFTER THE TRANSFUSION  Right after receiving a blood transfusion, you will usually feel much better and more energetic. This is especially true if your red blood cells have gotten low (anemic). The transfusion raises the level of the red blood cells which carry oxygen, and this usually causes an energy increase.  The nurse administering the transfusion will monitor you carefully for complications. HOME CARE INSTRUCTIONS  No special instructions are needed after a transfusion. You may find your energy is better. Speak with your caregiver about any limitations on activity for underlying diseases you may have. SEEK MEDICAL CARE IF:   Your condition is not improving after your transfusion.  You develop redness or irritation at the intravenous (IV) site. SEEK IMMEDIATE MEDICAL CARE IF:  Any of the following symptoms occur over the next 12 hours:  Shaking chills.  You have a temperature by mouth above 102 F (38.9 C), not controlled by medicine.  Chest, back, or muscle pain.  People around you feel you are not acting correctly or are confused.  Shortness of breath or difficulty breathing.  Dizziness and fainting.  You get a rash or develop hives.  You have a decrease in urine output.  Your urine turns a dark color or changes to pink, red, or brown. Any of the following symptoms occur over the next 10 days:  You have a temperature by mouth above 102 F (38.9 C), not controlled by medicine.  Shortness of breath.  Weakness after normal activity.  The white part of the eye turns yellow (jaundice).  You have a decrease in the amount of urine or are urinating less often.  Your urine turns a dark color or changes to pink, red, or brown. Document Released: 05/10/2000 Document Revised: 08/05/2011 Document Reviewed: 12/28/2007 ExitCare Patient Information 2014  [place], [place].  _______________________________________________________________________  Incentive Spirometer  An incentive spirometer is a tool that can help keep your lungs clear and active. This tool measures how well you are filling your lungs with each breath. Taking long deep breaths may help reverse or decrease the chance of developing breathing (pulmonary) problems (especially infection) following:  A long period of time when you are unable to move or be active. BEFORE THE PROCEDURE   If the spirometer includes an  indicator to show your best effort, your nurse or respiratory therapist will set it to a desired goal.  If possible, sit up straight or lean slightly forward. Try not to slouch.  Hold the incentive spirometer in an upright position. INSTRUCTIONS FOR USE  1. Sit on the edge of your bed if possible, or sit up as far as you can in bed or on a chair. 2. Hold the incentive spirometer in an upright position. 3. Breathe out normally. 4. Place the mouthpiece in your mouth and seal your lips tightly around it. 5. Breathe in slowly and as deeply as possible, raising the piston or the ball toward the top of the column. 6. Hold your breath for 3-5 seconds or for as long as possible. Allow the piston or ball to fall to the bottom of the column. 7. Remove the mouthpiece from your mouth and breathe out normally. 8. Rest for a few seconds and repeat Steps 1 through 7 at least 10 times every 1-2 hours when you are awake. Take your time and take a few normal breaths between deep breaths. 9. The spirometer may include an indicator to show your best effort. Use the indicator as a goal to work toward during each repetition. 10. After each set of 10 deep breaths, practice coughing to be sure your lungs are clear. If you have an incision (the cut made at the time of surgery), support your incision when coughing by placing a pillow or rolled up towels firmly against it. Once you are able to get  out of bed, walk around indoors and cough well. You may stop using the incentive spirometer when instructed by your caregiver.  RISKS AND COMPLICATIONS  Take your time so you do not get dizzy or light-headed.  If you are in pain, you may need to take or ask for pain medication before doing incentive spirometry. It is harder to take a deep breath if you are having pain. AFTER USE  Rest and breathe slowly and easily.  It can be helpful to keep track of a log of your progress. Your caregiver can provide you with a simple table to help with this. If you are using the spirometer at home, follow these instructions: SEEK MEDICAL CARE IF:   You are having difficultly using the spirometer.  You have trouble using the spirometer as often as instructed.  Your pain medication is not giving enough relief while using the spirometer.  You develop fever of 100.5 F (38.1 C) or higher. SEEK IMMEDIATE MEDICAL CARE IF:   You cough up bloody sputum that had not been present before.  You develop fever of 102 F (38.9 C) or greater.  You develop worsening pain at or near the incision site. MAKE SURE YOU:   Understand these instructions.  Will watch your condition.  Will get help right away if you are not doing well or get worse. Document Released: 09/23/2006 Document Revised: 08/05/2011 Document Reviewed: 11/24/2006 [facility] Patient Information 2014 [place], [place].   ________________________________________________________________________

## 2017-10-08 ENCOUNTER — Encounter (HOSPITAL_COMMUNITY)
Admission: RE | Admit: 2017-10-08 | Discharge: 2017-10-08 | Disposition: A | Discharge status: Home / Self Care | Payer: Managed Care, Other (non HMO) | Source: Ambulatory Visit | Attending: General Surgery | Admitting: General Surgery

## 2017-10-08 ENCOUNTER — Encounter (HOSPITAL_COMMUNITY): Payer: Self-pay

## 2017-10-08 ENCOUNTER — Other Ambulatory Visit: Payer: Self-pay

## 2017-10-08 DIAGNOSIS — E669 Obesity, unspecified: Secondary | ICD-10-CM | POA: Diagnosis not present

## 2017-10-08 DIAGNOSIS — Z01812 Encounter for preprocedural laboratory examination: Secondary | ICD-10-CM | POA: Insufficient documentation

## 2017-10-08 DIAGNOSIS — Z6841 Body Mass Index (BMI) 40.0 and over, adult: Secondary | ICD-10-CM | POA: Diagnosis not present

## 2017-10-08 HISTORY — DX: Gastro-esophageal reflux disease without esophagitis: K21.9

## 2017-10-08 HISTORY — DX: Type 2 diabetes mellitus without complications: E11.9

## 2017-10-08 LAB — COMPREHENSIVE METABOLIC PANEL
ALBUMIN: 4.2 g/dL (ref 3.5–5.0)
ALK PHOS: 58 U/L (ref 38–126)
ALT: 26 U/L (ref 14–54)
AST: 28 U/L (ref 15–41)
Anion gap: 10 (ref 5–15)
BUN: 22 mg/dL — AB (ref 6–20)
CO2: 27 mmol/L (ref 22–32)
CREATININE: 0.86 mg/dL (ref 0.44–1.00)
Calcium: 9.4 mg/dL (ref 8.9–10.3)
Chloride: 103 mmol/L (ref 101–111)
GFR calc non Af Amer: >60 mL/min (ref >60)
GLUCOSE: 93 mg/dL (ref 65–99)
Potassium: 4 mmol/L (ref 3.5–5.1)
SODIUM: 140 mmol/L (ref 135–145)
Total Bilirubin: 0.5 mg/dL (ref 0.3–1.2)
Total Protein: 8 g/dL (ref 6.5–8.1)

## 2017-10-08 LAB — CBC WITH DIFFERENTIAL/PLATELET
BASOS ABS: 0 10*3/uL (ref 0.0–0.1)
Basophils Relative: 0 %
Eosinophils Absolute: 0.1 10*3/uL (ref 0.0–0.7)
Eosinophils Relative: 2 %
HCT: 38.1 % (ref 36.0–46.0)
HEMOGLOBIN: 12.3 g/dL (ref 12.0–15.0)
LYMPHS ABS: 3.2 10*3/uL (ref 0.7–4.0)
Lymphocytes Relative: 44 %
MCH: 29.6 pg (ref 26.0–34.0)
MCHC: 32.3 g/dL (ref 30.0–36.0)
MCV: 91.6 fL (ref 78.0–100.0)
Monocytes Absolute: 0.6 10*3/uL (ref 0.1–1.0)
Monocytes Relative: 8 %
NEUTROS PCT: 46 %
Neutro Abs: 3.4 10*3/uL (ref 1.7–7.7)
Platelets: 240 10*3/uL (ref 150–400)
RBC: 4.16 MIL/uL (ref 3.87–5.11)
RDW: 15.3 % (ref 11.5–15.5)
WBC: 7.4 10*3/uL (ref 4.0–10.5)

## 2017-10-08 LAB — GLUCOSE, CAPILLARY: Glucose-Capillary: 94 mg/dL (ref 65–99)

## 2017-10-08 LAB — NO BLOOD PRODUCTS

## 2017-10-08 NOTE — Progress Notes (Signed)
ekg 05-14-17 epic  cxr 05-14-17 epic

## 2017-10-13 ENCOUNTER — Other Ambulatory Visit: Payer: Self-pay

## 2017-10-13 ENCOUNTER — Inpatient Hospital Stay (HOSPITAL_COMMUNITY): Payer: Managed Care, Other (non HMO) | Admitting: Certified Registered Nurse Anesthetist

## 2017-10-13 ENCOUNTER — Encounter (HOSPITAL_COMMUNITY): Payer: Self-pay | Admitting: Emergency Medicine

## 2017-10-13 ENCOUNTER — Encounter (HOSPITAL_COMMUNITY)
Admission: RE | Disposition: A | Discharge status: Home / Self Care | Payer: Self-pay | Source: Ambulatory Visit | Attending: General Surgery

## 2017-10-13 ENCOUNTER — Inpatient Hospital Stay (HOSPITAL_COMMUNITY)
Admission: RE | Admit: 2017-10-13 | Discharge: 2017-10-14 | DRG: 621 | Disposition: A | Discharge status: Home / Self Care | Payer: Managed Care, Other (non HMO) | Source: Ambulatory Visit | Attending: General Surgery | Admitting: General Surgery

## 2017-10-13 DIAGNOSIS — Z7984 Long term (current) use of oral hypoglycemic drugs: Secondary | ICD-10-CM

## 2017-10-13 DIAGNOSIS — K219 Gastro-esophageal reflux disease without esophagitis: Secondary | ICD-10-CM | POA: Diagnosis present

## 2017-10-13 DIAGNOSIS — Z833 Family history of diabetes mellitus: Secondary | ICD-10-CM

## 2017-10-13 DIAGNOSIS — I10 Essential (primary) hypertension: Secondary | ICD-10-CM | POA: Diagnosis present

## 2017-10-13 DIAGNOSIS — Z538 Procedure and treatment not carried out for other reasons: Secondary | ICD-10-CM | POA: Diagnosis present

## 2017-10-13 DIAGNOSIS — E119 Type 2 diabetes mellitus without complications: Secondary | ICD-10-CM | POA: Diagnosis present

## 2017-10-13 DIAGNOSIS — Z91048 Other nonmedicinal substance allergy status: Secondary | ICD-10-CM | POA: Diagnosis not present

## 2017-10-13 DIAGNOSIS — Z8249 Family history of ischemic heart disease and other diseases of the circulatory system: Secondary | ICD-10-CM | POA: Diagnosis not present

## 2017-10-13 DIAGNOSIS — Z79899 Other long term (current) drug therapy: Secondary | ICD-10-CM | POA: Diagnosis not present

## 2017-10-13 DIAGNOSIS — Z6841 Body Mass Index (BMI) 40.0 and over, adult: Secondary | ICD-10-CM | POA: Diagnosis not present

## 2017-10-13 HISTORY — PX: LAPAROSCOPIC GASTRIC SLEEVE RESECTION: SHX5895

## 2017-10-13 LAB — HEMOGLOBIN AND HEMATOCRIT, BLOOD
HCT: 35.5 % — ABNORMAL LOW (ref 36.0–46.0)
Hemoglobin: 11.1 g/dL — ABNORMAL LOW (ref 12.0–15.0)

## 2017-10-13 LAB — GLUCOSE, CAPILLARY
GLUCOSE-CAPILLARY: 164 mg/dL — AB (ref 65–99)
Glucose-Capillary: 154 mg/dL — ABNORMAL HIGH (ref 65–99)
Glucose-Capillary: 138 mg/dL — ABNORMAL HIGH (ref 65–99)

## 2017-10-13 LAB — PREGNANCY, URINE: Preg Test, Ur: NEGATIVE

## 2017-10-13 SURGERY — GASTRECTOMY, SLEEVE, LAPAROSCOPIC
Anesthesia: General | Site: Abdomen

## 2017-10-13 MED ORDER — GABAPENTIN 300 MG PO CAPS
300.0000 mg | ORAL_CAPSULE | ORAL | Status: AC
  Administered 2017-10-13: 300 mg via ORAL
  Filled 2017-10-13: qty 1

## 2017-10-13 MED ORDER — KETAMINE HCL 10 MG/ML IJ SOLN
INTRAMUSCULAR | Status: AC
  Filled 2017-10-13: qty 1

## 2017-10-13 MED ORDER — LACTATED RINGERS IR SOLN
Status: DC | PRN
  Administered 2017-10-13: 1000 mL

## 2017-10-13 MED ORDER — METFORMIN HCL 500 MG PO TABS
500.0000 mg | ORAL_TABLET | Freq: Two times a day (BID) | ORAL | Status: DC
  Administered 2017-10-13 – 2017-10-14 (×2): 500 mg via ORAL
  Filled 2017-10-13 (×2): qty 1

## 2017-10-13 MED ORDER — ONDANSETRON HCL 4 MG/2ML IJ SOLN
INTRAMUSCULAR | Status: AC
  Filled 2017-10-13: qty 2

## 2017-10-13 MED ORDER — MORPHINE SULFATE (PF) 2 MG/ML IV SOLN
1.0000 mg | INTRAVENOUS | Status: DC | PRN
  Administered 2017-10-13 (×2): 1 mg via INTRAVENOUS
  Filled 2017-10-13 (×2): qty 1

## 2017-10-13 MED ORDER — APREPITANT 40 MG PO CAPS
40.0000 mg | ORAL_CAPSULE | ORAL | Status: AC
  Administered 2017-10-13: 40 mg via ORAL
  Filled 2017-10-13: qty 1

## 2017-10-13 MED ORDER — ACETAMINOPHEN 500 MG PO TABS
1000.0000 mg | ORAL_TABLET | ORAL | Status: AC
  Administered 2017-10-13: 1000 mg via ORAL
  Filled 2017-10-13: qty 2

## 2017-10-13 MED ORDER — LACTATED RINGERS IV SOLN
INTRAVENOUS | Status: DC
  Administered 2017-10-13 (×2): via INTRAVENOUS

## 2017-10-13 MED ORDER — ENSURE PRE-SURGERY PO LIQD
592.0000 mL | Freq: Once | ORAL | Status: DC
  Filled 2017-10-13: qty 592

## 2017-10-13 MED ORDER — FENTANYL CITRATE (PF) 250 MCG/5ML IJ SOLN
INTRAMUSCULAR | Status: AC
  Filled 2017-10-13: qty 5

## 2017-10-13 MED ORDER — MIDAZOLAM HCL 5 MG/5ML IJ SOLN
INTRAMUSCULAR | Status: DC | PRN
  Administered 2017-10-13: 1 mg via INTRAVENOUS

## 2017-10-13 MED ORDER — PREMIER PROTEIN SHAKE
2.0000 [oz_av] | ORAL | Status: DC
  Administered 2017-10-14 (×4): 2 [oz_av] via ORAL

## 2017-10-13 MED ORDER — CHLORHEXIDINE GLUCONATE 4 % EX LIQD: 60.0000 mL | Freq: Once | CUTANEOUS | Status: DC

## 2017-10-13 MED ORDER — PHENYLEPHRINE 40 MCG/ML (10ML) SYRINGE FOR IV PUSH (FOR BLOOD PRESSURE SUPPORT)
PREFILLED_SYRINGE | INTRAVENOUS | Status: AC
  Filled 2017-10-13: qty 20

## 2017-10-13 MED ORDER — PROPOFOL 10 MG/ML IV BOLUS
INTRAVENOUS | Status: AC
  Filled 2017-10-13: qty 20

## 2017-10-13 MED ORDER — LIDOCAINE 2% (20 MG/ML) 5 ML SYRINGE
INTRAMUSCULAR | Status: DC | PRN
  Administered 2017-10-13: 100 mg via INTRAVENOUS

## 2017-10-13 MED ORDER — HYDRALAZINE HCL 20 MG/ML IJ SOLN: 10.0000 mg | INTRAMUSCULAR | Status: DC | PRN

## 2017-10-13 MED ORDER — ENSURE PRE-SURGERY PO LIQD
296.0000 mL | Freq: Once | ORAL | Status: DC
  Filled 2017-10-13: qty 296

## 2017-10-13 MED ORDER — KETAMINE HCL 10 MG/ML IJ SOLN
INTRAMUSCULAR | Status: DC | PRN
  Administered 2017-10-13: 50 mg via INTRAVENOUS

## 2017-10-13 MED ORDER — LIDOCAINE 2% (20 MG/ML) 5 ML SYRINGE
INTRAMUSCULAR | Status: AC
  Filled 2017-10-13: qty 5

## 2017-10-13 MED ORDER — METOPROLOL TARTRATE 25 MG PO TABS
25.0000 mg | ORAL_TABLET | Freq: Two times a day (BID) | ORAL | Status: DC
  Administered 2017-10-13 – 2017-10-14 (×3): 25 mg via ORAL
  Filled 2017-10-13 (×3): qty 1

## 2017-10-13 MED ORDER — CEFOTETAN DISODIUM-DEXTROSE 2-2.08 GM-%(50ML) IV SOLR
2.0000 g | INTRAVENOUS | Status: AC
  Administered 2017-10-13: 2 g via INTRAVENOUS
  Filled 2017-10-13: qty 50

## 2017-10-13 MED ORDER — MIDAZOLAM HCL 2 MG/2ML IJ SOLN
INTRAMUSCULAR | Status: AC
  Filled 2017-10-13: qty 2

## 2017-10-13 MED ORDER — EPHEDRINE SULFATE 50 MG/ML IJ SOLN
INTRAMUSCULAR | Status: AC
  Filled 2017-10-13: qty 1

## 2017-10-13 MED ORDER — ROCURONIUM BROMIDE 50 MG/5ML IV SOSY
PREFILLED_SYRINGE | INTRAVENOUS | Status: DC | PRN
  Administered 2017-10-13: 10 mg via INTRAVENOUS
  Administered 2017-10-13: 50 mg via INTRAVENOUS

## 2017-10-13 MED ORDER — PHENYLEPHRINE 40 MCG/ML (10ML) SYRINGE FOR IV PUSH (FOR BLOOD PRESSURE SUPPORT)
PREFILLED_SYRINGE | INTRAVENOUS | Status: DC | PRN
  Administered 2017-10-13: 120 ug via INTRAVENOUS
  Administered 2017-10-13: 80 ug via INTRAVENOUS
  Administered 2017-10-13: 40 ug via INTRAVENOUS

## 2017-10-13 MED ORDER — PHENYLEPHRINE HCL 10 MG/ML IJ SOLN
INTRAMUSCULAR | Status: DC | PRN
  Administered 2017-10-13: 120 ug via INTRAVENOUS

## 2017-10-13 MED ORDER — FENTANYL CITRATE (PF) 100 MCG/2ML IJ SOLN: 25.0000 ug | INTRAMUSCULAR | Status: DC | PRN

## 2017-10-13 MED ORDER — FENTANYL CITRATE (PF) 100 MCG/2ML IJ SOLN
INTRAMUSCULAR | Status: DC | PRN
  Administered 2017-10-13: 150 ug via INTRAVENOUS

## 2017-10-13 MED ORDER — SCOPOLAMINE 1 MG/3DAYS TD PT72
1.0000 | MEDICATED_PATCH | TRANSDERMAL | Status: DC
  Administered 2017-10-13: 1.5 mg via TRANSDERMAL
  Filled 2017-10-13: qty 1

## 2017-10-13 MED ORDER — SUGAMMADEX SODIUM 500 MG/5ML IV SOLN
INTRAVENOUS | Status: AC
  Filled 2017-10-13: qty 5

## 2017-10-13 MED ORDER — DEXAMETHASONE SODIUM PHOSPHATE 10 MG/ML IJ SOLN
INTRAMUSCULAR | Status: DC | PRN
  Administered 2017-10-13: 4 mg via INTRAVENOUS

## 2017-10-13 MED ORDER — FAMOTIDINE IN NACL 20-0.9 MG/50ML-% IV SOLN
20.0000 mg | Freq: Two times a day (BID) | INTRAVENOUS | Status: DC
  Administered 2017-10-13 – 2017-10-14 (×3): 20 mg via INTRAVENOUS
  Filled 2017-10-13 (×3): qty 50

## 2017-10-13 MED ORDER — BUPIVACAINE LIPOSOME 1.3 % IJ SUSP
20.0000 mL | Freq: Once | INTRAMUSCULAR | Status: AC
  Administered 2017-10-13: 20 mL
  Filled 2017-10-13: qty 20

## 2017-10-13 MED ORDER — ONDANSETRON HCL 4 MG/2ML IJ SOLN
4.0000 mg | INTRAMUSCULAR | Status: DC | PRN
  Administered 2017-10-13 (×2): 4 mg via INTRAVENOUS
  Filled 2017-10-13 (×2): qty 2

## 2017-10-13 MED ORDER — LIDOCAINE HCL 2 % IJ SOLN
INTRAMUSCULAR | Status: AC
  Filled 2017-10-13: qty 20

## 2017-10-13 MED ORDER — PROMETHAZINE HCL 25 MG/ML IJ SOLN: 6.2500 mg | INTRAMUSCULAR | Status: DC | PRN

## 2017-10-13 MED ORDER — PROPOFOL 10 MG/ML IV BOLUS
INTRAVENOUS | Status: DC | PRN
  Administered 2017-10-13: 150 mg via INTRAVENOUS

## 2017-10-13 MED ORDER — DEXAMETHASONE SODIUM PHOSPHATE 4 MG/ML IJ SOLN: 4.0000 mg | INTRAMUSCULAR | Status: DC

## 2017-10-13 MED ORDER — SODIUM CHLORIDE 0.9 % IJ SOLN
INTRAMUSCULAR | Status: AC
  Filled 2017-10-13: qty 10

## 2017-10-13 MED ORDER — HEPARIN SODIUM (PORCINE) 5000 UNIT/ML IJ SOLN
5000.0000 [IU] | INTRAMUSCULAR | Status: AC
  Administered 2017-10-13: 5000 [IU] via SUBCUTANEOUS
  Filled 2017-10-13: qty 1

## 2017-10-13 MED ORDER — ROCURONIUM BROMIDE 10 MG/ML (PF) SYRINGE
PREFILLED_SYRINGE | INTRAVENOUS | Status: AC
  Filled 2017-10-13: qty 5

## 2017-10-13 MED ORDER — ONDANSETRON HCL 4 MG/2ML IJ SOLN
INTRAMUSCULAR | Status: DC | PRN
  Administered 2017-10-13: 4 mg via INTRAVENOUS

## 2017-10-13 MED ORDER — SIMETHICONE 80 MG PO CHEW
80.0000 mg | CHEWABLE_TABLET | Freq: Four times a day (QID) | ORAL | Status: DC | PRN
  Administered 2017-10-13 – 2017-10-14 (×2): 80 mg via ORAL
  Filled 2017-10-13 (×2): qty 1

## 2017-10-13 MED ORDER — ACETAMINOPHEN 160 MG/5ML PO SOLN
650.0000 mg | Freq: Four times a day (QID) | ORAL | Status: DC
  Administered 2017-10-13 – 2017-10-14 (×4): 650 mg via ORAL
  Filled 2017-10-13 (×5): qty 20.3

## 2017-10-13 MED ORDER — SODIUM CHLORIDE 0.9 % IV SOLN
INTRAVENOUS | Status: DC
  Administered 2017-10-13: 10:00:00 via INTRAVENOUS
  Administered 2017-10-14: 1000 mL via INTRAVENOUS

## 2017-10-13 MED ORDER — OXYCODONE HCL 5 MG/5ML PO SOLN
5.0000 mg | ORAL | Status: DC | PRN
  Administered 2017-10-13 – 2017-10-14 (×3): 10 mg via ORAL
  Filled 2017-10-13 (×3): qty 10

## 2017-10-13 MED ORDER — LIDOCAINE 2% (20 MG/ML) 5 ML SYRINGE
INTRAMUSCULAR | Status: DC | PRN
  Administered 2017-10-13: 1 mg/kg/h via INTRAVENOUS

## 2017-10-13 MED ORDER — ENOXAPARIN SODIUM 30 MG/0.3ML ~~LOC~~ SOLN
30.0000 mg | Freq: Two times a day (BID) | SUBCUTANEOUS | Status: DC
  Administered 2017-10-13 – 2017-10-14 (×2): 30 mg via SUBCUTANEOUS
  Filled 2017-10-13 (×2): qty 0.3

## 2017-10-13 MED ORDER — EPHEDRINE SULFATE-NACL 50-0.9 MG/10ML-% IV SOSY
PREFILLED_SYRINGE | INTRAVENOUS | Status: DC | PRN
  Administered 2017-10-13 (×6): 5 mg via INTRAVENOUS

## 2017-10-13 MED ORDER — DEXAMETHASONE SODIUM PHOSPHATE 10 MG/ML IJ SOLN
INTRAMUSCULAR | Status: AC
  Filled 2017-10-13: qty 1

## 2017-10-13 MED ORDER — SUGAMMADEX SODIUM 500 MG/5ML IV SOLN
INTRAVENOUS | Status: DC | PRN
  Administered 2017-10-13: 250 mg via INTRAVENOUS

## 2017-10-13 MED ORDER — BUPIVACAINE HCL (PF) 0.25 % IJ SOLN
INTRAMUSCULAR | Status: AC
  Filled 2017-10-13: qty 30

## 2017-10-13 MED ORDER — SUCCINYLCHOLINE CHLORIDE 200 MG/10ML IV SOSY
PREFILLED_SYRINGE | INTRAVENOUS | Status: AC
  Filled 2017-10-13: qty 10

## 2017-10-13 MED ORDER — BUPIVACAINE HCL 0.25 % IJ SOLN
INTRAMUSCULAR | Status: DC | PRN
  Administered 2017-10-13: 30 mL

## 2017-10-13 SURGICAL SUPPLY — 57 items
APPLIER CLIP 5 13 M/L LIGAMAX5 (MISCELLANEOUS)
APPLIER CLIP ROT 13.4 12 LRG (CLIP)
BAG LAPAROSCOPIC 12 15 PORT 16 (BASKET) ×1 IMPLANT
BAG RETRIEVAL 12/15 (BASKET) ×2
BAG RETRIEVAL 12/15MM (BASKET) ×1
BANDAGE ADH SHEER 1  50/CT (GAUZE/BANDAGES/DRESSINGS) ×18 IMPLANT
BENZOIN TINCTURE PRP APPL 2/3 (GAUZE/BANDAGES/DRESSINGS) ×3 IMPLANT
BLADE SURG SZ11 CARB STEEL (BLADE) ×3 IMPLANT
CABLE HIGH FREQUENCY MONO STRZ (ELECTRODE) ×3 IMPLANT
CHLORAPREP W/TINT 26ML (MISCELLANEOUS) ×3 IMPLANT
CLIP APPLIE 5 13 M/L LIGAMAX5 (MISCELLANEOUS) IMPLANT
CLIP APPLIE ROT 13.4 12 LRG (CLIP) IMPLANT
CLOSURE WOUND 1/2 X4 (GAUZE/BANDAGES/DRESSINGS) ×1
COVER SURGICAL LIGHT HANDLE (MISCELLANEOUS) ×3 IMPLANT
DRAIN CHANNEL 19F RND (DRAIN) IMPLANT
DRAPE UNIVERSAL PACK (DRAPES) ×3 IMPLANT
DRAPE UTILITY XL STRL (DRAPES) ×6 IMPLANT
ELECT REM PT RETURN 15FT ADLT (MISCELLANEOUS) ×3 IMPLANT
EVACUATOR SILICONE 100CC (DRAIN) IMPLANT
GAUZE SPONGE 4X4 12PLY STRL (GAUZE/BANDAGES/DRESSINGS) IMPLANT
GLOVE BIOGEL PI IND STRL 7.0 (GLOVE) ×1 IMPLANT
GLOVE BIOGEL PI INDICATOR 7.0 (GLOVE) ×2
GLOVE SURG SS PI 7.0 STRL IVOR (GLOVE) ×3 IMPLANT
GOWN STRL REUS W/TWL LRG LVL3 (GOWN DISPOSABLE) ×3 IMPLANT
GOWN STRL REUS W/TWL XL LVL3 (GOWN DISPOSABLE) ×9 IMPLANT
GRASPER SUT TROCAR 14GX15 (MISCELLANEOUS) ×3 IMPLANT
HANDLE STAPLE EGIA 4 XL (STAPLE) ×3 IMPLANT
HOVERMATT SINGLE USE (MISCELLANEOUS) ×3 IMPLANT
KIT BASIN OR (CUSTOM PROCEDURE TRAY) ×3 IMPLANT
MARKER SKIN DUAL TIP RULER LAB (MISCELLANEOUS) ×3 IMPLANT
NEEDLE SPNL 22GX3.5 QUINCKE BK (NEEDLE) ×3 IMPLANT
RELOAD EGIA 45 MED/THCK PURPLE (STAPLE) IMPLANT
RELOAD EGIA 60 MED/THCK PURPLE (STAPLE) ×9 IMPLANT
RELOAD EGIA BLACK ROTIC 45MM (STAPLE) IMPLANT
RELOAD TRI 2.0 60 XTHK VAS SUL (STAPLE) ×6 IMPLANT
SCISSORS LAP 5X45 EPIX DISP (ENDOMECHANICALS) IMPLANT
SET IRRIG TUBING LAPAROSCOPIC (IRRIGATION / IRRIGATOR) ×3 IMPLANT
SHEARS HARMONIC ACE PLUS 45CM (MISCELLANEOUS) ×3 IMPLANT
SLEEVE GASTRECTOMY 40FR VISIGI (MISCELLANEOUS) ×3 IMPLANT
SLEEVE XCEL OPT CAN 5 100 (ENDOMECHANICALS) ×6 IMPLANT
SOLUTION ANTI FOG 6CC (MISCELLANEOUS) ×3 IMPLANT
SPONGE LAP 18X18 RF (DISPOSABLE) ×3 IMPLANT
STRIP CLOSURE SKIN 1/2X4 (GAUZE/BANDAGES/DRESSINGS) ×2 IMPLANT
SUT ETHIBOND 0 36 GRN (SUTURE) IMPLANT
SUT ETHILON 2 0 PS N (SUTURE) IMPLANT
SUT MNCRL AB 4-0 PS2 18 (SUTURE) ×3 IMPLANT
SUT VICRYL 0 TIES 12 18 (SUTURE) ×3 IMPLANT
SYR 20CC LL (SYRINGE) ×3 IMPLANT
SYR 50ML LL SCALE MARK (SYRINGE) ×3 IMPLANT
TOWEL OR 17X26 10 PK STRL BLUE (TOWEL DISPOSABLE) ×3 IMPLANT
TOWEL OR NON WOVEN STRL DISP B (DISPOSABLE) ×3 IMPLANT
TROCAR BLADELESS 15MM (ENDOMECHANICALS) ×3 IMPLANT
TROCAR BLADELESS OPT 5 100 (ENDOMECHANICALS) ×3 IMPLANT
TUBING CONNECTING 10 (TUBING) ×2 IMPLANT
TUBING CONNECTING 10' (TUBING) ×1
TUBING ENDO SMARTCAP PENTAX (MISCELLANEOUS) IMPLANT
TUBING INSUF HEATED (TUBING) ×3 IMPLANT

## 2017-10-13 NOTE — Transfer of Care (Signed)
Immediate Anesthesia Transfer of Care Note  Patient: Sheila Benson  Procedure(s) Performed: LAPAROSCOPIC GASTRIC SLEEVE RESECTION  AND ERAS PATHWAY (N/A Abdomen)  Patient Location: PACU  Anesthesia Type:General  Level of Consciousness: drowsy and patient cooperative  Airway & Oxygen Therapy: Patient Spontanous Breathing and Patient connected to face mask oxygen  Post-op Assessment: Report given to RN and Post -op Vital signs reviewed and stable  Post vital signs: Reviewed and stable  Last Vitals:  Vitals Value Taken Time  BP 166/112 10/13/2017  9:02 AM  Temp    Pulse 92 10/13/2017  9:03 AM  Resp 19 10/13/2017  9:03 AM  SpO2 99 % 10/13/2017  9:03 AM  Vitals shown include unvalidated device data.  Last Pain:  Vitals:   10/13/17 0604  TempSrc:   PainSc: 0-No pain      Patients Stated Pain Goal: 4 (10/13/17 0604)  Complications: No apparent anesthesia complications

## 2017-10-13 NOTE — Discharge Instructions (Signed)
° ° ° °GASTRIC BYPASS/SLEEVE ° Home Care Instructions ° ° These instructions are to help you care for yourself when you go home. ° °Call: If you have any problems. °• Call [phone] and ask for the surgeon on call °• If you need immediate help, come to the ER at [facility].  °• Tell the ER staff that you are a new post-op gastric bypass or gastric sleeve patient °  °Signs and symptoms to report: • Severe vomiting or nausea °o If you cannot keep down clear liquids for longer than 1 day, call your surgeon  °• Abdominal pain that does not get better after taking your pain medication °• Fever over 100.4° F with chills °• Heart beating over 100 beats a minute °• Shortness of breath at rest °• Chest pain °•  Redness, swelling, drainage, or foul odor at incision (surgical) sites °•  If your incisions open or pull apart °• Swelling or pain in calf (lower leg) °• Diarrhea (Loose bowel movements that happen often), frequent watery, uncontrolled bowel movements °• Constipation, (no bowel movements for 3 days) if this happens: Pick one °o Milk of Magnesia, 2 tablespoons by mouth, 3 times a day for 2 days if needed °o Stop taking Milk of Magnesia once you have a bowel movement °o Call your doctor if constipation continues °Or °o Miralax  (instead of Milk of Magnesia) following the label instructions °o Stop taking Miralax once you have a bowel movement °o Call your doctor if constipation continues °• Anything you think is not normal °  °Normal side effects after surgery: • Unable to sleep at night or unable to focus °• Irritability or moody °• Being tearful (crying) or depressed °These are common complaints, possibly related to your anesthesia medications that put you to sleep, stress of surgery, and change in lifestyle.  This usually goes away a few weeks after surgery.  If these feelings continue, call your primary care doctor. °  °Wound Care: You may have surgical glue, steri-strips, or staples over your incisions after  surgery °• Surgical glue:  Looks like a clear film over your incisions and will wear off a little at a time °• Steri-strips: Strips of tape over your incisions. You may notice a yellowish color on the skin under the steri-strips. This is used to make the   steri-strips stick better. Do not pull the steri-strips off - let them fall off °• Staples: Staples may be removed before you leave the hospital °o If you go home with staples, call [facility], [phone] at for an appointment with your surgeon’s nurse to have staples removed 10 days after surgery. °• Showering: You may shower two (2) days after your surgery unless your surgeon tells you differently °o Wash gently around incisions with warm soapy water, rinse well, and gently pat dry  °o No tub baths until staples are removed, steri-strips fall off or glue is gone.  °  °Medications: • Medications should be liquid or crushed if larger than the size of a dime °• Extended release pills (medication that release a little bit at a time through the day) should NOT be crushed or cut. (examples include XL, ER, DR, SR) °• Depending on the size and number of medications you take, you may need to space (take a few throughout the day)/change the time you take your medications so that you do not over-fill your pouch (smaller stomach) °• Make sure you follow-up with your primary care doctor to   make medication changes needed during rapid weight loss and life-style changes °• If you have diabetes, follow up with the doctor that orders your diabetes medication(s) within one week after surgery and check your blood sugar regularly. °• Do not drive while taking prescription pain medication  °• It is ok to take Tylenol by the bottle instructions with your pain medicine or instead of your pain medicine as needed.  DO NOT TAKE NSAIDS (EXAMPLES OF NSAIDS:  IBUPROFREN/ NAPROXEN)  °Diet:                    First 2 Weeks ° You will see the dietician t about two (2) weeks  after your surgery. The dietician will increase the types of foods you can eat if you are handling liquids well: °• If you have severe vomiting or nausea and cannot keep down clear liquids lasting longer than 1 day, call your surgeon @ ([phone]) °Protein Shake °• Drink at least 2 ounces of shake 5-6 times per day °• Each serving of protein shakes (usually 8 - 12 ounces) should have: °o 15 grams of protein  °o And no more than 5 grams of carbohydrate  °• Goal for protein each day: °o Men = 80 grams per day °o Women = 60 grams per day °• Protein powder may be added to fluids such as non-fat milk or Lactaid milk or unsweetened Soy/Almond milk (limit to 35 grams added protein powder per serving) ° °Hydration °• Slowly increase the amount of water and other clear liquids as tolerated (See Acceptable Fluids) °• Slowly increase the amount of protein shake as tolerated  °•  Sip fluids slowly and throughout the day.  Do not use straws. °• May use sugar substitutes in small amounts (no more than 6 - 8 packets per day; i.e. Splenda) ° °Fluid Goal °• The first goal is to drink at least 8 ounces of protein shake/drink per day (or as directed by the nutritionist); some examples of protein shakes are Syntrax Nectar, Adkins Advantage, EAS Edge HP, and Unjury. See handout from pre-op Bariatric Education Class: °o Slowly increase the amount of protein shake you drink as tolerated °o You may find it easier to slowly sip shakes throughout the day °o It is important to get your proteins in first °• Your fluid goal is to drink 64 - 100 ounces of fluid daily °o It may take a few weeks to build up to this °• 32 oz (or more) should be clear liquids  °And  °• 32 oz (or more) should be full liquids (see below for examples) °• Liquids should not contain sugar, caffeine, or carbonation ° °Clear Liquids: °• Water or Sugar-free flavored water (i.e. Fruit H2O, Propel) °• Decaffeinated coffee or tea (sugar-free) °• Crystal Lite, Wyler’s Lite,  Minute Maid Lite °• Sugar-free Jell-O °• Bouillon or broth °• Sugar-free Popsicle:   *Less than 20 calories each; Limit 1 per day ° °Full Liquids: °Protein Shakes/Drinks + 2 choices per day of other full liquids °• Full liquids must be: °o No More Than 15 grams of Carbs per serving  °o No More Than 3 grams of Fat per serving °• Strained low-fat cream soup (except Cream of Potato or Tomato) °• Non-Fat milk °• Fat-free Lactaid Milk °• Unsweetened Soy Or Unsweetened Almond Milk °• Low Sugar yogurt (Dannon Lite & Fit, Greek yogurt; Oikos Triple Zero; Chobani Simply 100; Yoplait 100 calorie Greek - No Fruit on the Bottom) ° °  °Vitamins   and Minerals • Start 1 day after surgery unless otherwise directed by your surgeon °• 2 Chewable Bariatric Specific Multivitamin / Multimineral Supplement with iron (Example: Bariatric Advantage Multi EA) °• Chewable Calcium with Vitamin D-3 °(Example: 3 Chewable Calcium Plus 600 with Vitamin D-3) °o Take 500 mg three (3) times a day for a total of 1500 mg each day °o Do not take all 3 doses of calcium at one time as it may cause constipation, and you can only absorb 500 mg  at a time  °o Do not mix multivitamins containing iron with calcium supplements; take 2 hours apart °• Menstruating women and those with a history of anemia (a blood disease that causes weakness) may need extra iron °o Talk with your doctor to see if you need more iron °• Do not stop taking or change any vitamins or minerals until you talk to your dietitian or surgeon °• Your Dietitian and/or surgeon must approve all vitamin and mineral supplements °  °Activity and Exercise: Limit your physical activity as instructed by your doctor.  It is important to continue walking at home.  During this time, use these guidelines: °• Do not lift anything greater than ten (10) pounds for at least two (2) weeks °• Do not go back to work or drive until your surgeon says you can °• You may have sex when you feel comfortable  °o It is  VERY important for female patients to use a reliable birth control method; fertility often increases after surgery  °o All hormonal birth control will be ineffective for 30 days after surgery due to medications given during surgery a barrier method must be used. °o Do not get pregnant for at least 18 months °• Start exercising as soon as your doctor tells you that you can °o Make sure your doctor approves any physical activity °• Start with a simple walking program °• Walk 5-15 minutes each day, 7 days per week.  °• Slowly increase until you are walking 30-45 minutes per day °Consider joining our BELT program. [phone] or email [email or web address] °  °Special Instructions Things to remember: °• Use your CPAP when sleeping if this applies to you ° °• [facility] has two free Bariatric Surgery Support Groups that meet monthly °o The 3rd Thursday of each month, 6 pm, [facility]  °o The 2nd Friday of each month, 11:45 am in the private dining room in the basement of [facility] °• It is very important to keep all follow up appointments with your surgeon, dietitian, primary care physician, and behavioral health practitioner °• Routine follow up schedule with your surgeon include appointments at 2-3 weeks, 6-8 weeks, 6 months, and 1 year at a minimum.  Your surgeon may request to see you more often.   °o After the first year, please follow up with your bariatric surgeon and dietitian at least once a year in order to maintain best weight loss results °[facility]: [phone] °[facility]: [phone] °Bariatric Nurse Coordinator: [phone] °  °   Reviewed and Endorsed  °by  Patient Education Committee, June, 2016 °Edits Approved: Aug, 2018 ° ° ° °

## 2017-10-13 NOTE — Anesthesia Preprocedure Evaluation (Addendum)
Anesthesia Evaluation  Patient identified by MRN, date of birth, ID band Patient awake    Reviewed: Allergy & Precautions, NPO status , Patient's Chart, lab work & pertinent test results, reviewed documented beta blocker date and time   Airway Mallampati: II  TM Distance: >3 FB Neck ROM: Full    Dental  (+) Teeth Intact, Dental Advisory Given   Pulmonary neg pulmonary ROS,    Pulmonary exam normal breath sounds clear to auscultation       Cardiovascular hypertension, Pt. on home beta blockers and Pt. on medications Normal cardiovascular exam Rhythm:Regular Rate:Normal     Neuro/Psych negative neurological ROS  negative psych ROS   GI/Hepatic Neg liver ROS, GERD  ,  Endo/Other  diabetes, Type 2, Oral Hypoglycemic AgentsMorbid obesity  Renal/GU negative Renal ROS     Musculoskeletal negative musculoskeletal ROS (+)   Abdominal   Peds  Hematology  (+) REFUSES BLOOD PRODUCTS,   Anesthesia Other Findings Day of surgery medications reviewed with the patient.  Reproductive/Obstetrics                             Anesthesia Physical Anesthesia Plan  ASA: III  Anesthesia Plan: General   Post-op Pain Management:    Induction: Intravenous  PONV Risk Score and Plan: 4 or greater and Scopolamine patch - Pre-op, Midazolam, Dexamethasone and Ondansetron  Airway Management Planned: Oral ETT  Additional Equipment:   Intra-op Plan:   Post-operative Plan: Extubation in OR  Informed Consent: I have reviewed the patients History and Physical, chart, labs and discussed the procedure including the risks, benefits and alternatives for the proposed anesthesia with the patient or authorized representative who has indicated his/her understanding and acceptance.   Dental advisory given  Plan Discussed with: CRNA  Anesthesia Plan Comments:         Anesthesia Quick Evaluation

## 2017-10-13 NOTE — Op Note (Signed)
Preop Diagnosis: Obesity Class III  Postop Diagnosis: same  Procedure performed: laparoscopic Sleeve Gastrectomy  Assitant: Wenda Low  Indications:  The patient is a 57 y.o. year-old morbidly obese female who has been followed in the Bariatric Clinic as an outpatient. This patient was diagnosed with morbid obesity with a BMI of Body mass index is 42.98 kg/m. and significant co-morbidities including non-insulin dependent diabetes.  The patient was counseled extensively in the Bariatric Outpatient Clinic and after a thorough explanation of the risks and benefits of surgery (including death from complications, bowel leak, infection such as peritonitis and/or sepsis, internal hernia, bleeding, need for blood transfusion, bowel obstruction, organ failure, pulmonary embolus, deep venous thrombosis, wound infection, incisional hernia, skin breakdown, and others entailed on the consent form) and after a compliant diet and exercise program, the patient was scheduled for an elective laparoscopic sleeve gastrectomy.  Description of Operation:  Following informed consent, the patient was taken to the operating room and placed on the operating table in the supine position.  She had previously received prophylactic antibiotics and subcutaneous heparin for DVT prophylaxis in the pre-op holding area.  After induction of general endotracheal anesthesia by the anesthesiologist, the patient underwent placement of sequential compression devices and an oro-gastric tube.  A timeout was confirmed by the surgery and anesthesia teams.  The patient was adequately padded at all pressure points and placed on a footboard to prevent slippage from the OR table during extremes of position during surgery.  She underwent a routine sterile prep and drape of her entire abdomen.    Next, A transverse incision was made under the left subcostal area and a 5mm optical viewing trocar was introduced into the peritoneal cavity.  Pneumoperitoneum was applied with a high flow and low pressure. A laparoscope was inserted to confirm placement. A extraperitoneal block was then placed at the lateral abdominal wall using exparel diluted with marcaine. 5 additional incisions were placed: 1 5mm trocar to the left of the midline. 1 additional 5mm trocar in the left lateral area, 1 12mm trocar in the right mid abdomen, 1 5mm trocar in the right subcostal area, and a Nathanson retractor was placed through a subxiphoid incision.  The fat pad at the GE junction was incised and the gastrodiaphragmatic ligament was divided using the Harmonic scalpel. Next, a hole was created through the lesser omentum along the greater curve of the stomach to enter the lesser sac. The vessels along the greater omentum were  Then ligated and divided using the Harmonic scalpel moving towards the spleen and then short gastric vessels were ligated and divided in the same fashion to fully mobilize the fundus. The left crus was identified to ensure completion of the dissection. Next the antrum was measured and dissection continued inferiorly along the greater curve towards the pylorus and stopped 6cm from the pylorus.   A 40Fr ViSiGi dilator was placed into the esophgaus and along the lesser curve of the stomach and placed on suction. 2 non-reinforced 60mm 4-78mm tristapler(s) followed by 3 60mm 3-78mm tristaplers were used to make the resection along the antrum being sure to stay well away from the angularis by angling the jaws of the stapler towards the greater curve and later completing the resection staying along the ViSiGi and ensuring the fundus was not retained by appropriately retracting it lateral. Air was inserted through the ViSiGi to perform a leak test showing no bubbles and a neutral lie of the stomach.  The assistant then went and performed  an upper endoscopy and leak test. Due to equipment failure, endoscopy was unable to be performed. After troubleshooting,  that portion of the procedure was cancelled. The specimen was then placed in an endocatch bag and removed by the 15mm port. There was one area of bleeding along the staple line that was clipped with titanium clip applier and achieved hemostasis. The fascia of the 15mm port was closed with a 0 vicryl by suture passer. Hemostasis was ensured. Pneumoperitoneum was evacuated, all ports were removed and all incisions closed with 4-0 monocryl suture in subcuticular fashion. Steristrips and bandaids were put in place for dressing. The patient awoke from anesthesia and was brought to pacu in stable condition. All counts were correct.  Estimated blood loss: <60ml  Specimens:  Sleeve gastrectomy  Local Anesthesia: 50 ml Exparel:0.5% Marcaine mix  Post-Op Plan:       Pain Management: PO, prn      Antibiotics: Prophylactic      Anticoagulation: Prophylactic, Starting now      Post Op Studies/Consults: Not applicable      Intended Discharge: within 48h      Intended Outpatient Follow-Up: Two Week      Intended Outpatient Studies: Not Applicable      Other: Not Applicable  Images:        De Blanch Kinsinger

## 2017-10-13 NOTE — Anesthesia Procedure Notes (Signed)
Procedure Name: Intubation Date/Time: 10/13/2017 7:25 AM Performed by: Montel Clock, CRNA Pre-anesthesia Checklist: Patient identified, Emergency Drugs available, Suction available and Patient being monitored Patient Re-evaluated:Patient Re-evaluated prior to induction Oxygen Delivery Method: Circle system utilized Preoxygenation: Pre-oxygenation with 100% oxygen Induction Type: IV induction and Cricoid Pressure applied Ventilation: Oral airway inserted - appropriate to patient size and Two handed mask ventilation required Laryngoscope Size: Mac and 4 Grade View: Grade I Tube type: Oral Tube size: 7.0 mm Number of attempts: 1 Airway Equipment and Method: Stylet Placement Confirmation: ETT inserted through vocal cords under direct vision,  positive ETCO2,  CO2 detector and breath sounds checked- equal and bilateral Secured at: 21 cm Tube secured with: Tape

## 2017-10-13 NOTE — Progress Notes (Signed)
Discussed post op day goals with patient including ambulation, IS, diet progression, pain, and nausea control. Patient started water at this time.  Questions answered.

## 2017-10-13 NOTE — H&P (Signed)
Sheila Benson is an 57 y.o. female.   Chief Complaint: obesity HPI: 57 yo female with diabetes and long history of class III obesity. She has completed all requirements and is ready to proceed with sleeve gastrectomy.  Past Medical History:  Diagnosis Date  . Diabetes mellitus without complication (HCC)    TYPE 2  . GERD (gastroesophageal reflux disease)   . Hypertension     Past Surgical History:  Procedure Laterality Date  . APPENDECTOMY  8/13   ruptured, long hospital stay  . CARPAL TUNNEL RELEASE Bilateral   . CESAREAN SECTION     x3  . KNEE ARTHROSCOPY     Left  . LAPAROSCOPIC GASTRIC SLEEVE RESECTION      Family History  Problem Relation Age of Onset  . Diabetes Mother   . Hypertension Father   . Diabetes Sister   . Diabetes Brother   . Diabetes Brother   . Diabetes Sister   . Heart disease Other    Social History:  reports that she has never smoked. She has never used smokeless tobacco. She reports that she does not drink alcohol or use drugs.  Allergies:  Allergies  Allergen Reactions  . Other     Blood refusal  . Pollen Extract Other (See Comments)    Sneezing     Medications Prior to Admission  Medication Sig Dispense Refill  . atorvastatin (LIPITOR) 10 MG tablet Take 10 mg by mouth daily.    . cetirizine (ALL DAY ALLERGY) 10 MG tablet Take 10 mg by mouth daily.    Marland Kitchen losartan-hydrochlorothiazide (HYZAAR) 100-12.5 MG tablet Take 1 tablet by mouth daily. 90 tablet 1  . metFORMIN (GLUCOPHAGE) 500 MG tablet Take 500 mg by mouth 2 (two) times daily with a meal.    . metoprolol tartrate (LOPRESSOR) 25 MG tablet Take 25 mg by mouth 2 (two) times daily.    . Multiple Vitamins-Minerals (MULTIVITAMIN WITH MINERALS) tablet Take 1 tablet by mouth daily.      Results for orders placed or performed during the hospital encounter of 10/13/17 (from the past 48 hour(s))  Pregnancy, urine STAT morning of surgery     Status: None   Collection Time: 10/13/17  5:45 AM   Result Value Ref Range   Preg Test, Ur NEGATIVE NEGATIVE    Comment:        THE SENSITIVITY OF THIS METHODOLOGY IS >20 mIU/mL. Performed at [facility], [address] [place], [place] [address]   Glucose, capillary     Status: Abnormal   Collection Time: 10/13/17  5:57 AM  Result Value Ref Range   Glucose-Capillary 164 (H) 65 - 99 mg/dL   Comment 1 Notify RN    Comment 2 Document in Chart    No results found.  Review of Systems  Constitutional: Negative for chills and fever.  HENT: Negative for hearing loss.   Eyes: Negative for blurred vision and double vision.  Respiratory: Negative for cough and hemoptysis.   Cardiovascular: Negative for chest pain and palpitations.  Gastrointestinal: Negative for abdominal pain, nausea and vomiting.  Genitourinary: Negative for dysuria and urgency.  Musculoskeletal: Negative for myalgias and neck pain.  Skin: Negative for itching and rash.  Neurological: Negative for dizziness, tingling and headaches.  Endo/Heme/Allergies: Does not bruise/bleed easily.  Psychiatric/Behavioral: Negative for depression and suicidal ideas.    Blood pressure (!) 155/94, pulse 73, temperature 98.4 F (36.9 C), temperature source Oral, resp. rate 18, height 5' 3.5" (1.613 m),  weight 111.8 kg (246 lb 8 oz), last menstrual period 01/27/2012, SpO2 100 %. Physical Exam  Vitals reviewed. Constitutional: She is oriented to person, place, and time. She appears well-developed and well-nourished.  HENT:  Head: Normocephalic and atraumatic.  Eyes: Pupils are equal, round, and reactive to light. Conjunctivae and EOM are normal.  Neck: Normal range of motion. Neck supple.  Cardiovascular: Normal rate and regular rhythm.  Respiratory: Effort normal and breath sounds normal.  GI: Soft. Bowel sounds are normal. She exhibits no distension. There is no tenderness.  Musculoskeletal: Normal range of motion.  Neurological: She is alert and oriented to  person, place, and time.  Skin: Skin is warm and dry.  Psychiatric: She has a normal mood and affect. Her behavior is normal.     Assessment/Plan 57 yo female with class III obesity -sleeve gastrectomy -bariatric protocol -enhanced recovery protool  Rodman Pickle, MD 10/13/2017, 7:07 AM

## 2017-10-13 NOTE — Progress Notes (Signed)
Pt ambulated from PACU stretcher, to bathroom and recliner.

## 2017-10-14 ENCOUNTER — Encounter (HOSPITAL_COMMUNITY): Payer: Self-pay | Admitting: General Surgery

## 2017-10-14 LAB — COMPREHENSIVE METABOLIC PANEL
ALK PHOS: 41 U/L (ref 38–126)
ALT: 39 U/L (ref 14–54)
AST: 36 U/L (ref 15–41)
Albumin: 3.6 g/dL (ref 3.5–5.0)
Anion gap: 11 (ref 5–15)
BUN: 18 mg/dL (ref 6–20)
CHLORIDE: 103 mmol/L (ref 101–111)
CO2: 23 mmol/L (ref 22–32)
CREATININE: 0.92 mg/dL (ref 0.44–1.00)
Calcium: 8.5 mg/dL — ABNORMAL LOW (ref 8.9–10.3)
GFR calc Af Amer: >60 mL/min (ref >60)
GFR calc non Af Amer: >60 mL/min (ref >60)
GLUCOSE: 123 mg/dL — AB (ref 65–99)
Potassium: 4.2 mmol/L (ref 3.5–5.1)
SODIUM: 137 mmol/L (ref 135–145)
Total Bilirubin: 0.6 mg/dL (ref 0.3–1.2)
Total Protein: 6.9 g/dL (ref 6.5–8.1)

## 2017-10-14 LAB — CBC WITH DIFFERENTIAL/PLATELET
BASOS ABS: 0 10*3/uL (ref 0.0–0.1)
Basophils Relative: 0 %
Eosinophils Absolute: 0 10*3/uL (ref 0.0–0.7)
Eosinophils Relative: 0 %
HCT: 34.6 % — ABNORMAL LOW (ref 36.0–46.0)
HEMOGLOBIN: 10.8 g/dL — AB (ref 12.0–15.0)
LYMPHS PCT: 18 %
Lymphs Abs: 1.9 10*3/uL (ref 0.7–4.0)
MCH: 28.6 pg (ref 26.0–34.0)
MCHC: 31.2 g/dL (ref 30.0–36.0)
MCV: 91.8 fL (ref 78.0–100.0)
Monocytes Absolute: 0.8 10*3/uL (ref 0.1–1.0)
Monocytes Relative: 7 %
NEUTROS PCT: 75 %
Neutro Abs: 8 10*3/uL — ABNORMAL HIGH (ref 1.7–7.7)
Platelets: 237 10*3/uL (ref 150–400)
RBC: 3.77 MIL/uL — AB (ref 3.87–5.11)
RDW: 15.4 % (ref 11.5–15.5)
WBC: 10.7 10*3/uL — AB (ref 4.0–10.5)

## 2017-10-14 MED ORDER — PANTOPRAZOLE SODIUM 40 MG PO TBEC: 40.0000 mg | DELAYED_RELEASE_TABLET | Freq: Every day | ORAL | 1 refills | Status: AC

## 2017-10-14 MED ORDER — LOSARTAN POTASSIUM 25 MG PO TABS
25.0000 mg | ORAL_TABLET | Freq: Every day | ORAL | Status: DC
  Administered 2017-10-14: 25 mg via ORAL
  Filled 2017-10-14: qty 1

## 2017-10-14 MED ORDER — TRAMADOL HCL 50 MG PO TABS: 50.0000 mg | ORAL_TABLET | Freq: Three times a day (TID) | ORAL | 0 refills | Status: AC | PRN

## 2017-10-14 MED ORDER — ONDANSETRON 4 MG PO TBDP: 4.0000 mg | ORAL_TABLET | Freq: Three times a day (TID) | ORAL | 0 refills | Status: AC | PRN

## 2017-10-14 NOTE — Progress Notes (Signed)
Patient alert and oriented, pain is controlled. Patient is tolerating fluids, advanced to protein shake today, patient is tolerating well.  Reviewed Gastric sleeve discharge instructions with patient and patient is able to articulate understanding.  Provided information on BELT program, Support Group and [facility] outpatient pharmacy. All questions answered, will continue to monitor.  Total 24 hour fluid intake 760 Per dehydration protocol call back within one week of surgery

## 2017-10-14 NOTE — Progress Notes (Signed)
Patient alert and oriented, Post op day 1.  Provided support and encouragement.  Encouraged pulmonary toilet, ambulation and small sips of liquids.  Completed 12 ounces of bariatric clear fluid and began protein shakes.  All questions answered.  Will continue to monitor.

## 2017-10-14 NOTE — Anesthesia Postprocedure Evaluation (Signed)
Anesthesia Post Note  Patient: Sheila Benson  Procedure(s) Performed: LAPAROSCOPIC GASTRIC SLEEVE RESECTION  AND ERAS PATHWAY (N/A Abdomen)     Patient location during evaluation: PACU Anesthesia Type: General Level of consciousness: awake and alert Pain management: pain level controlled Vital Signs Assessment: post-procedure vital signs reviewed and stable Respiratory status: spontaneous breathing, nonlabored ventilation and respiratory function stable Cardiovascular status: blood pressure returned to baseline and stable Postop Assessment: no apparent nausea or vomiting Anesthetic complications: no    Last Vitals:  Vitals:   10/14/17 1115 10/14/17 1334  BP: 121/72 129/81  Pulse: 79 78  Resp: 16 16  Temp: 36.6 C 37 C  SpO2: 98% 97%    Last Pain:  Vitals:   10/14/17 1334  TempSrc: Oral  PainSc:                  Cecile Hearing

## 2017-10-14 NOTE — Discharge Summary (Signed)
Physician Discharge Summary  Sheila Benson ZOX:[id number] DOB: December 29, 1960 DOA: 10/13/2017  PCP: Renford Dills, MD  Admit date: 10/13/2017 Discharge date: 10/14/2017  Recommendations for Outpatient Follow-up:  1.  (include homehealth, outpatient follow-up instructions, specific recommendations for PCP to follow-up on, etc.)  Follow-up Information    Kinsinger, De Blanch, MD. Go on 11/05/2017.   Specialty:  General Surgery Why:  at [address] information: [address] [place] [address] [phone]        Kinsinger, De Blanch, MD .   Specialty:  General Surgery Contact information: [address] [place] [address] [phone]          Discharge Diagnoses:  Active Problems:   Morbid obesity (HCC)   Surgical Procedure: Laparoscopic Sleeve Gastrectomy, upper endoscopy  Discharge Condition: Good Disposition: Home  Diet recommendation: Postoperative sleeve gastrectomy diet (liquids only)  Filed Weights   10/13/17 0547  Weight: 111.8 kg (246 lb 8 oz)     Hospital Course:  The patient was admitted after undergoing laparoscopic sleeve gastrectomy. POD 0 she ambulated well. POD 1 she was started on the water diet protocol and tolerated 300 ml in the first shift. Once meeting the water amount she was advanced to bariatric protein shakes which they tolerated and were discharged home POD 1.  Treatments: surgery: laparoscopic sleeve gastrectomy  Discharge Instructions   Allergies as of 10/14/2017      Reactions   Other    Blood refusal   Pollen Extract Other (See Comments)   Sneezing       Medication List    TAKE these medications   ALL DAY ALLERGY 10 MG tablet Generic drug:  cetirizine Take 10 mg by mouth daily.   atorvastatin 10 MG tablet Commonly known as:  LIPITOR Take 10 mg by mouth daily.   losartan-hydrochlorothiazide 100-12.5 MG tablet Commonly known as:  HYZAAR Take 1 tablet by mouth daily. Notes to patient:   Monitor Blood Pressure Daily and keep a log for primary care physician.  You may need to make changes to your medications with rapid weight loss.  Monitor for symptoms of dehydration   metFORMIN 500 MG tablet Commonly known as:  GLUCOPHAGE Take 500 mg by mouth 2 (two) times daily with a meal. Notes to patient:  Monitor Blood Sugar Frequently and keep a log for primary care physician, you may need to adjust medication dosage with rapid weight loss.     metoprolol tartrate 25 MG tablet Commonly known as:  LOPRESSOR Take 25 mg by mouth 2 (two) times daily. Notes to patient:  Monitor Blood Pressure Daily and keep a log for primary care physician.  You may need to make changes to your medications with rapid weight loss.     multivitamin with minerals tablet Take 1 tablet by mouth daily.   ondansetron 4 MG disintegrating tablet Commonly known as:  ZOFRAN ODT Take 1 tablet (4 mg total) by mouth every 8 (eight) hours as needed for nausea or vomiting.   pantoprazole 40 MG tablet Commonly known as:  PROTONIX Take 1 tablet (40 mg total) by mouth daily.   traMADol 50 MG tablet Commonly known as:  ULTRAM Take 1 tablet (50 mg total) by mouth every 8 (eight) hours as needed for up to 1 dose.      Follow-up Information    Kinsinger, De Blanch, MD. Go on 11/05/2017.   Specialty:  General Surgery Why:  at [address] Contact information: [address] [place] [address] [phone]        Kinsinger, De Blanch, MD .   Specialty:  General Surgery Contact information: [address] [place] [address] [phone]            The results of significant diagnostics from this hospitalization (including imaging, microbiology, ancillary and laboratory) are listed below for reference.    Significant Diagnostic Studies: No results found.  Labs: Basic Metabolic Panel: Recent Labs  Lab 10/08/17 1503 10/14/17 0406  NA 140 137  K 4.0 4.2  CL 103 103  CO2 27 23   GLUCOSE 93 123*  BUN 22* 18  CREATININE 0.86 0.92  CALCIUM 9.4 8.5*   Liver Function Tests: Recent Labs  Lab 10/08/17 1503 10/14/17 0406  AST 28 36  ALT 26 39  ALKPHOS 58 41  BILITOT 0.5 0.6  PROT 8.0 6.9  ALBUMIN 4.2 3.6    CBC: Recent Labs  Lab 10/08/17 1503 10/13/17 0914 10/14/17 0406  WBC 7.4  --  10.7*  NEUTROABS 3.4  --  8.0*  HGB 12.3 11.1* 10.8*  HCT 38.1 35.5* 34.6*  MCV 91.6  --  91.8  PLT 240  --  237    CBG: Recent Labs  Lab 10/08/17 1427 10/13/17 0557 10/13/17 0946 10/13/17 1649  GLUCAP 94 164* 154* 138*    Active Problems:   Morbid obesity (HCC)   Time coordinating discharge: 

## 2017-10-16 ENCOUNTER — Telehealth (HOSPITAL_COMMUNITY): Payer: Self-pay

## 2017-10-16 NOTE — Telephone Encounter (Signed)
Patient called to discuss post bariatric surgery follow up questions.  See below:   1.  Tell me about your pain and pain management?has had to take pain medication one time which decreased the pain  2.  Let's talk about fluid intake.  How much total fluid are you taking in?50 ounces of total fluid  3.  How much protein have you taken in the last 2 days?40-45 grams of protein since discharge  4.  Have you had nausea?  Tell me about when have experienced nausea and what you did to help?has not experienced nausea  5.  Has the frequency or color changed with your urine?urinating frequently light in color  6.  Tell me what your incisions look like?steri strips in place no problems  7.  Have you been passing gas? BM?passing has and had bm this am  8.  If a problem or question were to arise who would you call?  Do you know contact numbers for [facility], [facility], and [facility]?is aware of how to contact all agencies  9.  How has the walking going?walking every hour without difficulty  10.  How are your vitamins and calcium going?  How are you taking them?taking opurity once per day and calcium 3x per day  Asking if could fly in one month, instructed patient to patient portal/call [facility].  Patient stated understanding

## 2017-10-24 ENCOUNTER — Other Ambulatory Visit: Payer: Self-pay | Admitting: General Surgery

## 2017-10-24 ENCOUNTER — Telehealth (HOSPITAL_COMMUNITY): Payer: Self-pay

## 2017-10-27 ENCOUNTER — Telehealth (HOSPITAL_COMMUNITY): Payer: Self-pay

## 2017-10-27 ENCOUNTER — Encounter (HOSPITAL_COMMUNITY): Payer: Self-pay

## 2017-10-27 NOTE — Telephone Encounter (Signed)
Follow up with patient after reports of dizziness and lightheadedness with [facility].  Patient stated the symptoms had dissipated and she was no longer having diarrhea.  Contact information provided.

## 2017-10-28 ENCOUNTER — Encounter: Payer: Managed Care, Other (non HMO) | Attending: General Surgery | Admitting: Skilled Nursing Facility1

## 2017-10-28 DIAGNOSIS — Z713 Dietary counseling and surveillance: Secondary | ICD-10-CM | POA: Insufficient documentation

## 2017-10-29 ENCOUNTER — Encounter: Payer: Self-pay | Admitting: Skilled Nursing Facility1

## 2017-10-29 NOTE — Progress Notes (Signed)
Bariatric Class:  Appt start time: 1530 end time:  1630.  2 Week Post-Operative Nutrition Class  Patient was seen on 10/28/2017 for Post-Operative Nutrition education at the [facility].   Surgery date: 10/13/2017 Surgery type: sleeve Start weight at [facility]: 248.9 Weight today: 233.6  TANITA  BODY COMP RESULTS  10/29/2017   BMI (kg/m^2) 40.7   Fat Mass (lbs) 118.6   Fat Free Mass (lbs) 115   Total Body Water (lbs) 83.6   The following the learning objectives were met by the patient during this course:  Identifies Phase 3A (Soft, High Proteins) Dietary Goals and will begin from 2 weeks post-operatively to 2 months post-operatively  Identifies appropriate sources of fluids and proteins   States protein recommendations and appropriate sources post-operatively  Identifies the need for appropriate texture modifications, mastication, and bite sizes when consuming solids  Identifies appropriate multivitamin and calcium sources post-operatively  Describes the need for physical activity post-operatively and will follow MD recommendations  States when to call healthcare provider regarding medication questions or post-operative complications  Handouts given during class include:  Phase 3A: Soft, High Protein Diet Handout  Follow-Up Plan: Patient will follow-up at [facility] in 6 weeks for 2 month post-op nutrition visit for diet advancement per MD.

## 2017-11-07 ENCOUNTER — Telehealth: Payer: Self-pay | Admitting: Registered"

## 2017-11-07 NOTE — Telephone Encounter (Signed)
RD called pt to verify fluid intake once starting soft, solid proteins 2 week post-bariatric surgery.   Daily Fluid intake: ~50 ounces Daily Protein intake: ~60 grams  Concerns/issues: none stated

## 2017-12-10 ENCOUNTER — Encounter: Payer: Self-pay | Admitting: Registered"

## 2017-12-10 ENCOUNTER — Encounter: Payer: Managed Care, Other (non HMO) | Attending: General Surgery | Admitting: Registered"

## 2017-12-10 DIAGNOSIS — Z713 Dietary counseling and surveillance: Secondary | ICD-10-CM | POA: Diagnosis not present

## 2017-12-10 DIAGNOSIS — E669 Obesity, unspecified: Secondary | ICD-10-CM

## 2017-12-10 NOTE — Patient Instructions (Addendum)
Goals:  Follow Phase 3B: High Protein + Non-Starchy Vegetables  Eat 3-6 small meals/snacks, every 3-5 hrs  Increase lean protein foods to meet 60g goal  Increase fluid intake to 64oz +  Avoid drinking 15 minutes before, during and 30 minutes after eating  Aim for >30 min of physical activity daily  Track protein intake when weaning off protein shakes  Take calcium supplements at least 2 hours from one another

## 2017-12-10 NOTE — Progress Notes (Signed)
Follow-up visit:  8 Weeks Post-Operative Sleeve Gastrectomy Surgery  Medical Nutrition Therapy:  Appt start time: 2:00 end time:  2:30.  Primary concerns today: Post-operative Bariatric Surgery Nutrition Management.  Non scale victories: no longer taking diabetes and blood pressure medications   Surgery date: 10/13/2017 Surgery type: sleeve [facility] 248.9 Weight today: pt declined Weight change: N/A due to pt declined from 233.6 (10/29/2017) Total weight lost: N/A Weight loss goal: improve quality of life, to be around for family (children and grandchildren)   TANITA  BODY COMP RESULTS  10/29/2017   BMI (kg/m^2) 40.7   Fat Mass (lbs) 118.6   Fat Free Mass (lbs) 115   Total Body Water (lbs) 83.6   Pt states she tolerates poultry, ground beef, very tender pork well. Pt states she gets bored quickly with food and prepares a lot in advance to choose from. Pt states she has bowel movements daily or every other day; if not she takes milk of magnesia.   Preferred Learning Style:   No preference indicated   Learning Readiness:   Ready  Change in progress  24-hr recall: B (AM): 1 egg (6g) Snk (AM): 1/2 banana or greek yogurt (15g) L (PM): protein shake (30g) or sliced meat (14g) Snk (PM): protein snack  D (PM): 3 oz tuna or salmon or chicken or 1/2 beef patty Snk (PM): none  Fluid intake: water with flavor packs (3-3.5 bottles), protein shake (1-1.5 bottles; 11-16.5 oz), greek yogurt, tea (8 oz); 64+ oz  Estimated total protein intake: 60+ grams  Medications: See list Supplementation: Opurity + 3 TUMS  CBG monitoring: yes, randomly Average CBG per patient: 9993 Last patient reported A1c: N/A  Using straws: no Drinking while eating: no Having you been chewing well: yes Chewing/swallowing difficulties: no Changes in vision: no Changes to mood/headaches: crying and sad mood sometimes; has appt with psychologist Hair loss/Changes to skin/Changes to nails: no,  no, no Any difficulty focusing or concentrating: no Sweating: no Dizziness/Lightheaded: only when getting up fast Palpitations: no  Carbonated beverages: no N/V/D/C/GAS: no, no, no, yse-milk of magnesia, no Abdominal Pain: no Dumping syndrome: no Last Lap-Band fill: N/A  Recent physical activity:  Walking, leg lifts, yard work, cutting grass 20 min, 6 days/week  Progress Towards Goal(s):  In progress.  Handouts given during visit include:  Phase IV: High Protein + NS vegetables   Nutritional Diagnosis:  Bylas-3.3 Overweight/obesity related to past poor dietary habits and physical inactivity as evidenced by patient w/ recent sleeve gastrectomy surgery following dietary guidelines for continued weight loss.     Intervention:  Nutrition education and counseling. Pt was educated and counseled on the next phase of bariatric post-op diet. Pt was in agreement with goals listed.  Goals:  Follow Phase 3B: High Protein + Non-Starchy Vegetables  Eat 3-6 small meals/snacks, every 3-5 hrs  Increase lean protein foods to meet 60g goal  Increase fluid intake to 64oz +  Avoid drinking 15 minutes before, during and 30 minutes after eating  Aim for >30 min of physical activity daily  Track protein intake when weaning off protein shakes  Take calcium supplements at least 2 hours from one another  Teaching Method Utilized:  Visual Auditory Hands on  Barriers to learning/adherence to lifestyle change: none identified  Demonstrated degree of understanding via:  Teach Back   Monitoring/Evaluation:  Dietary intake, exercise, lap band fills, and body weight. Follow up in 4 months for 6 month post-op visit.

## 2017-12-16 ENCOUNTER — Ambulatory Visit: Payer: Managed Care, Other (non HMO) | Admitting: Psychiatry

## 2017-12-17 ENCOUNTER — Ambulatory Visit (INDEPENDENT_AMBULATORY_CARE_PROVIDER_SITE_OTHER): Payer: Managed Care, Other (non HMO) | Admitting: Psychiatry

## 2017-12-17 DIAGNOSIS — F509 Eating disorder, unspecified: Secondary | ICD-10-CM

## 2018-01-21 ENCOUNTER — Ambulatory Visit (INDEPENDENT_AMBULATORY_CARE_PROVIDER_SITE_OTHER): Payer: Managed Care, Other (non HMO) | Admitting: Psychiatry

## 2018-01-21 DIAGNOSIS — F509 Eating disorder, unspecified: Secondary | ICD-10-CM | POA: Diagnosis not present

## 2018-04-07 ENCOUNTER — Encounter: Payer: 59 | Attending: General Surgery | Admitting: Skilled Nursing Facility1

## 2018-04-07 DIAGNOSIS — Z833 Family history of diabetes mellitus: Secondary | ICD-10-CM | POA: Diagnosis not present

## 2018-04-07 DIAGNOSIS — K219 Gastro-esophageal reflux disease without esophagitis: Secondary | ICD-10-CM | POA: Diagnosis not present

## 2018-04-07 DIAGNOSIS — E7800 Pure hypercholesterolemia, unspecified: Secondary | ICD-10-CM | POA: Insufficient documentation

## 2018-04-07 DIAGNOSIS — I10 Essential (primary) hypertension: Secondary | ICD-10-CM | POA: Diagnosis not present

## 2018-04-07 DIAGNOSIS — Z8249 Family history of ischemic heart disease and other diseases of the circulatory system: Secondary | ICD-10-CM | POA: Diagnosis not present

## 2018-04-07 DIAGNOSIS — Z713 Dietary counseling and surveillance: Secondary | ICD-10-CM | POA: Diagnosis not present

## 2018-04-07 DIAGNOSIS — Z6834 Body mass index (BMI) 34.0-34.9, adult: Secondary | ICD-10-CM | POA: Diagnosis not present

## 2018-04-07 DIAGNOSIS — Z79899 Other long term (current) drug therapy: Secondary | ICD-10-CM | POA: Insufficient documentation

## 2018-04-13 ENCOUNTER — Encounter: Payer: Self-pay | Admitting: Skilled Nursing Facility1

## 2018-04-13 NOTE — Progress Notes (Signed)
Follow-up visit:  Post-Operative sleeve Surgery  Medical Nutrition Therapy:  Appt start time: 6:00pm end time:  7:00pm  Primary concerns today: Post-operative Bariatric Surgery Nutrition Management 6 Month Post-Op Class  Surgery date: 10/13/2017 Surgery type: sleeve [facility]: 248.9 Weight today: 198.8 Weight loss goal: improve quality of life, to be around for family (children and grandchildren)   TANITA  BODY COMP RESULTS  10/29/2017 04/08/2018   BMI (kg/m^2) 40.7 34.7   Fat Mass (lbs) 118.6 91.6   Fat Free Mass (lbs) 115 107.2   Total Body Water (lbs) 83.6 76.6     Information Reviewed/ Discussed During Appointment: -Review of composition scale numbers -Fluid requirements (64-100 ounces) -Protein requirements (60-80g) -Strategies for tolerating diet -Advancement of diet to include Starchy vegetables -Barriers to inclusion of new foods -Inclusion of appropriate multivitamin and calcium supplements  -Exercise recommendations   Fluid intake: adequate   Medications: see list Supplementation: adequate  Using straws: no Drinking while eating: no Having you been chewing well: yes Chewing/swallowing difficulties: no Changes in vision: no Changes to mood/headaches: no Hair loss/Cahnges to skin/Changes to nails: no Any difficulty focusing or concentrating: no Sweating: no Dizziness/Lightheaded: no Palpitations: no  Carbonated beverages: no N/V/D/C/GAS: no Abdominal Pain: no Dumping syndrome: no  Recent physical activity:  adequate   Progress Towards Goal(s):  In progress.  Handouts given during visit include:  Phase V diet Progression   Goals Sheet  The Benefits of Exercise are endless.....  Support Group Topics  Pt Chosen Goals:  I will eat 3 new non-starchy vegetables every week by December 15th, 2019 I will eat 3 meals a day 5 days a week by November 31st, 2019  Teaching Method Utilized: Visual Auditory Hands on   Demonstrated degree  of understanding via:  Teach Back   Monitoring/Evaluation:  Dietary intake, exercise, and body weight. Follow up in 3 months for 9 month post-op visit.

## 2018-07-08 ENCOUNTER — Ambulatory Visit: Payer: Self-pay | Admitting: Skilled Nursing Facility1

## 2019-01-06 IMAGING — DX DG CHEST 2V
2 series · 2 of 2 positions shown · non-contrast
Comparison: 06/25/2015

CLINICAL DATA: Morbid obesity

EXAM:
CHEST  2 VIEW

[chest pa]
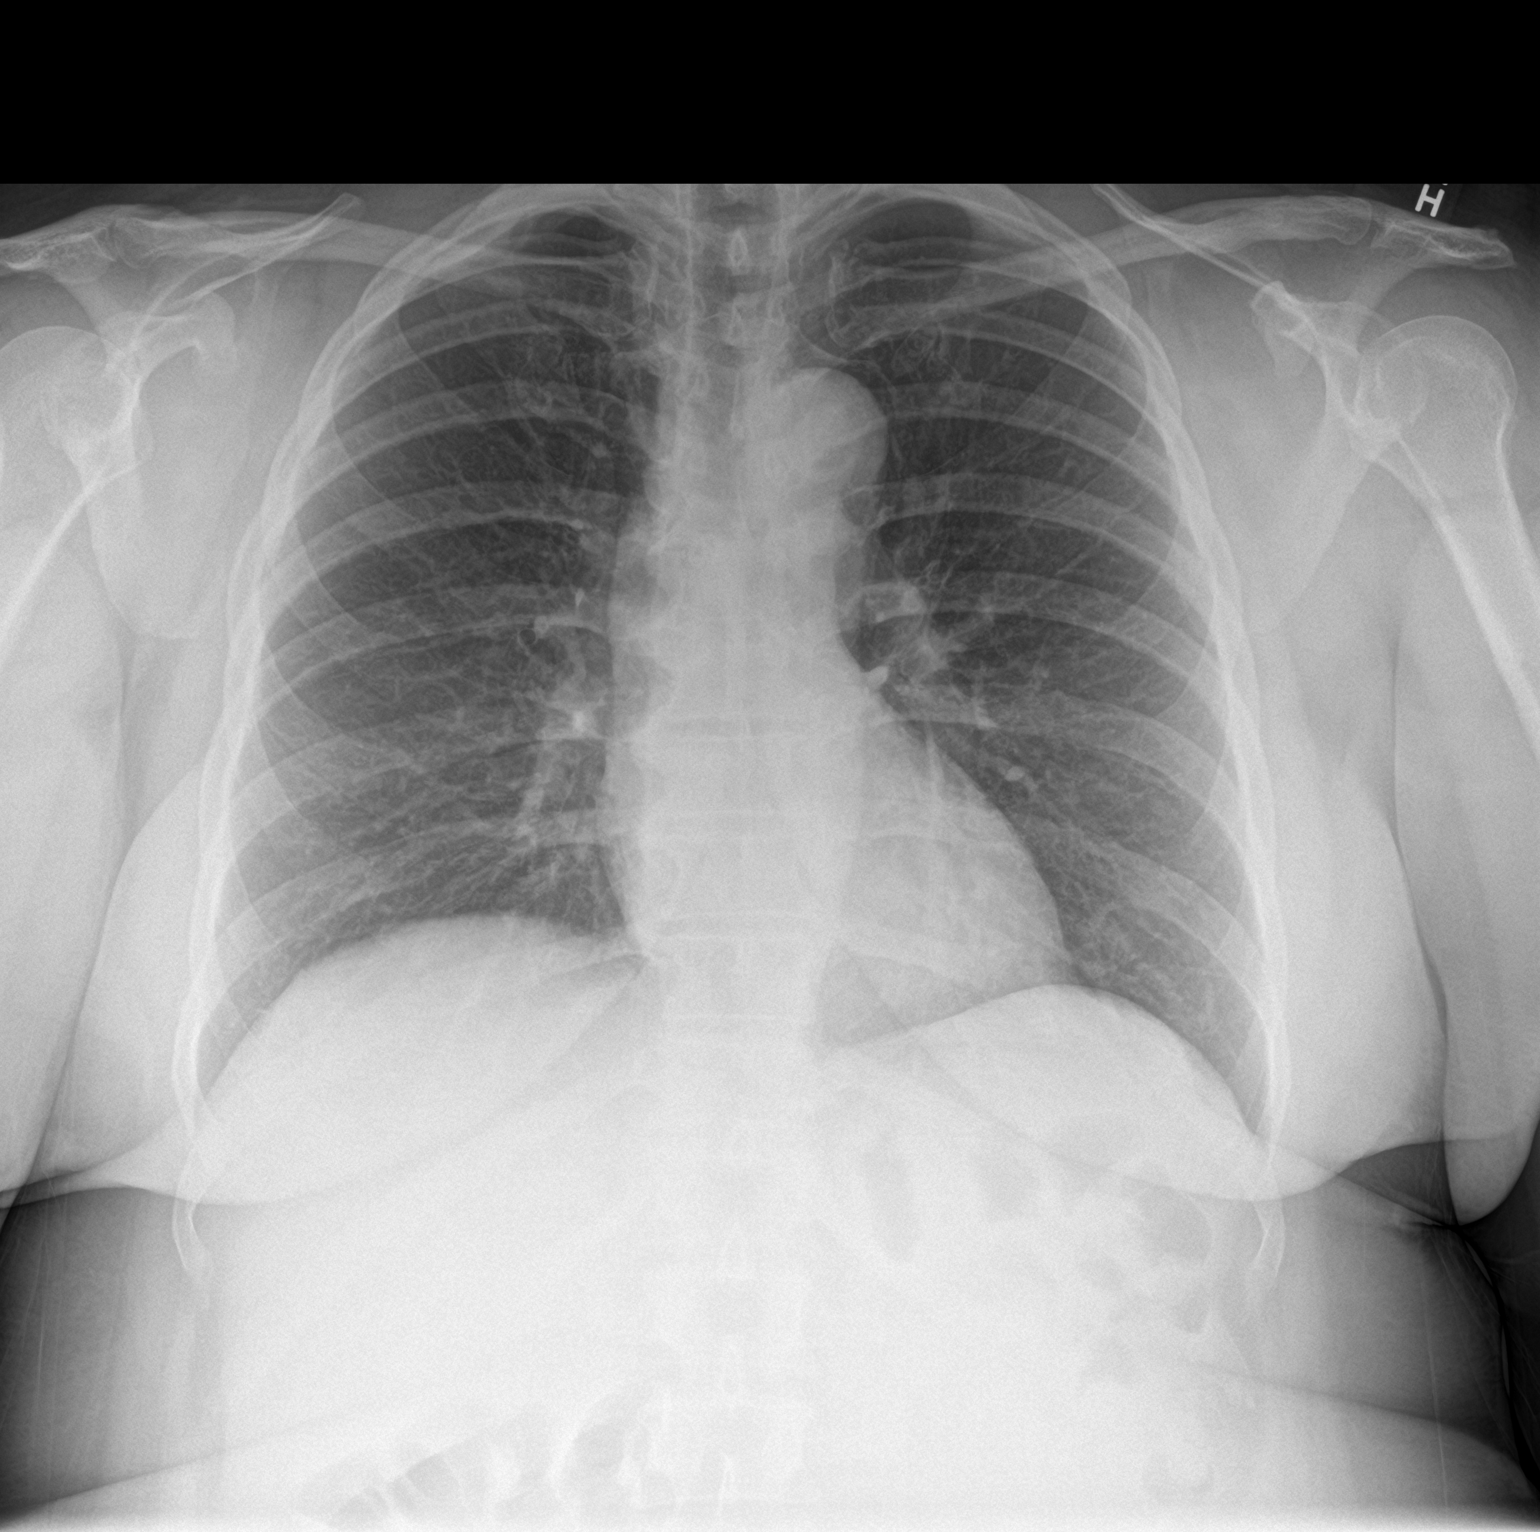

[chest lat]
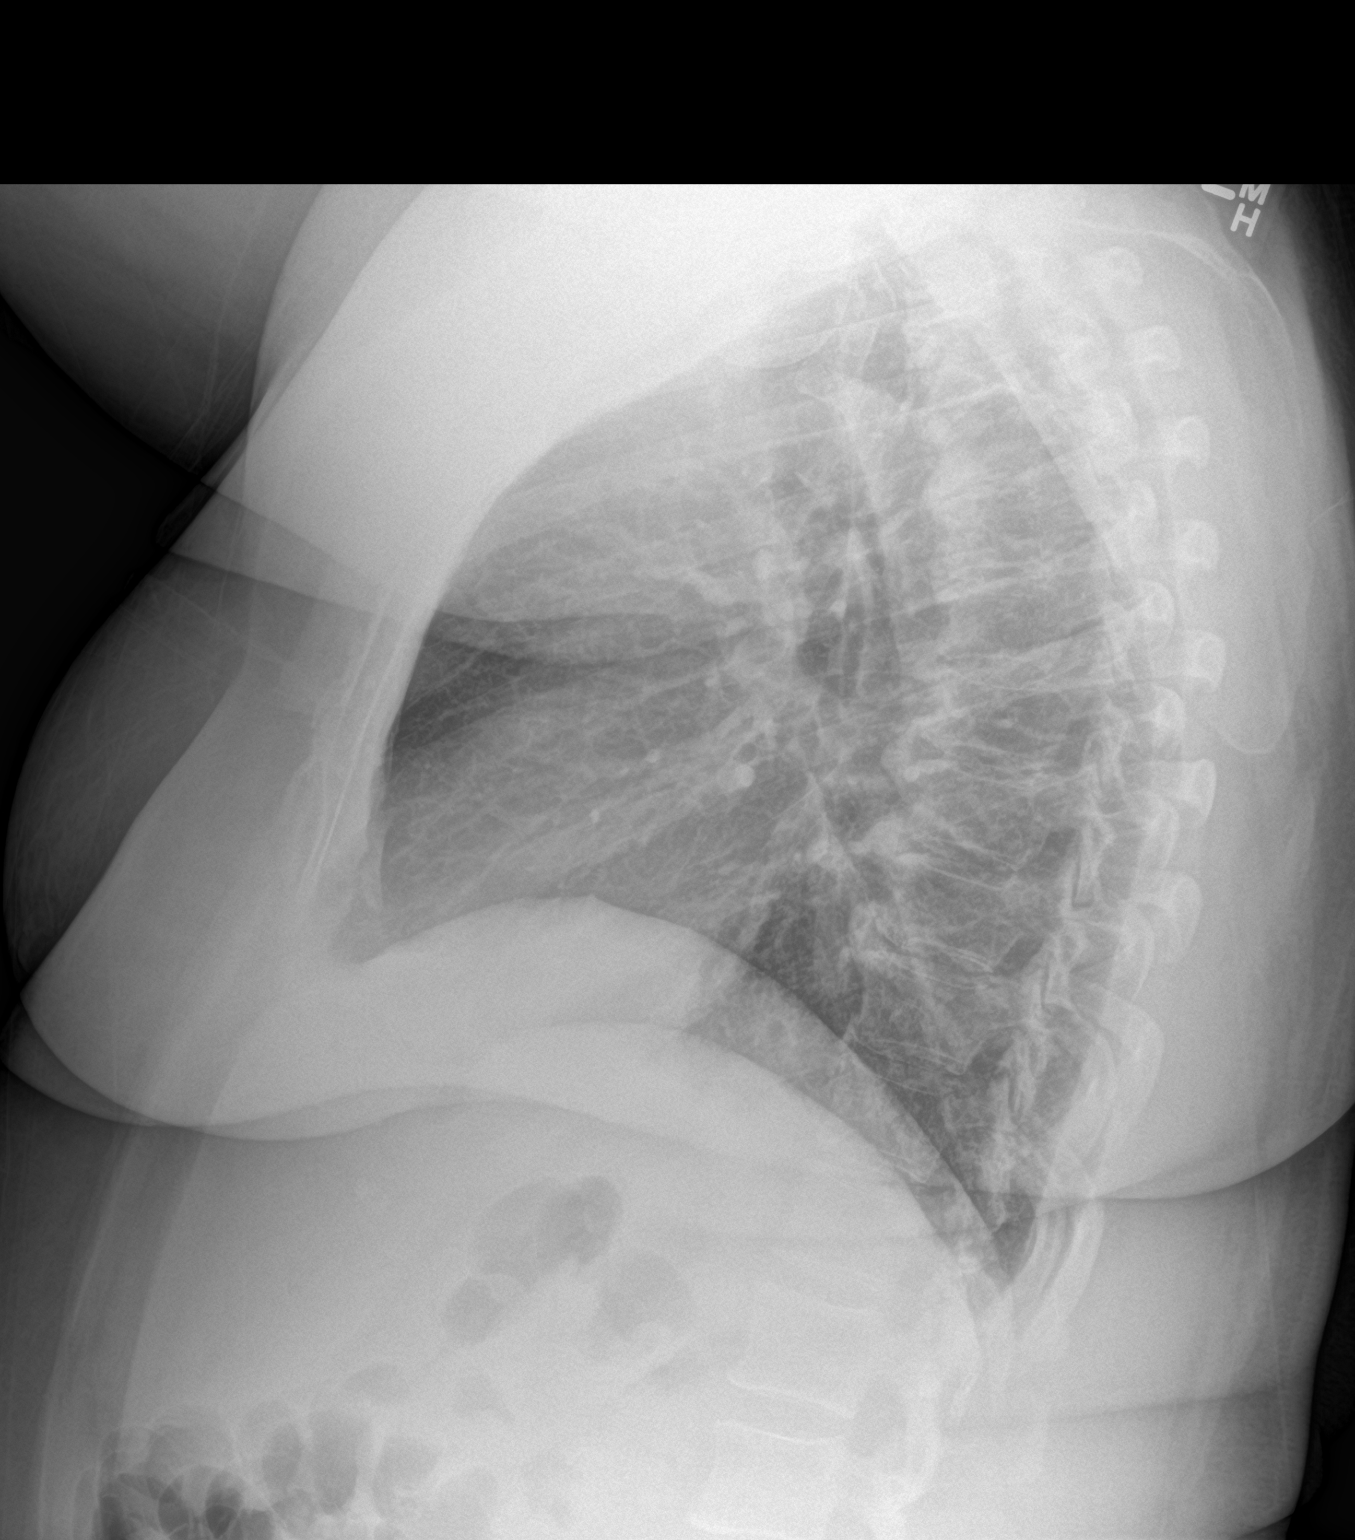

[2 of 2 positions shown; findings below may reference images not displayed]

FINDINGS: The heart size and mediastinal contours are within normal limits.
Both lungs are clear. The visualized skeletal structures are
unremarkable.
IMPRESSION: No active cardiopulmonary disease.

## 2019-05-05 ENCOUNTER — Encounter (HOSPITAL_COMMUNITY): Payer: Self-pay

## 2019-11-09 ENCOUNTER — Encounter (HOSPITAL_COMMUNITY): Payer: Self-pay

## 2019-11-09 ENCOUNTER — Emergency Department (HOSPITAL_COMMUNITY): Payer: 59

## 2019-11-09 ENCOUNTER — Other Ambulatory Visit: Payer: Self-pay

## 2019-11-09 ENCOUNTER — Emergency Department (HOSPITAL_COMMUNITY)
Admission: EM | Admit: 2019-11-09 | Discharge: 2019-11-10 | Disposition: A | Discharge status: Home / Self Care | Payer: 59 | Attending: Emergency Medicine | Admitting: Emergency Medicine

## 2019-11-09 DIAGNOSIS — R079 Chest pain, unspecified: Secondary | ICD-10-CM | POA: Insufficient documentation

## 2019-11-09 DIAGNOSIS — R791 Abnormal coagulation profile: Secondary | ICD-10-CM | POA: Insufficient documentation

## 2019-11-09 DIAGNOSIS — R0602 Shortness of breath: Secondary | ICD-10-CM | POA: Diagnosis not present

## 2019-11-09 DIAGNOSIS — R202 Paresthesia of skin: Secondary | ICD-10-CM | POA: Insufficient documentation

## 2019-11-09 DIAGNOSIS — Z79899 Other long term (current) drug therapy: Secondary | ICD-10-CM | POA: Diagnosis not present

## 2019-11-09 DIAGNOSIS — Z96652 Presence of left artificial knee joint: Secondary | ICD-10-CM | POA: Insufficient documentation

## 2019-11-09 DIAGNOSIS — E119 Type 2 diabetes mellitus without complications: Secondary | ICD-10-CM | POA: Insufficient documentation

## 2019-11-09 DIAGNOSIS — R7989 Other specified abnormal findings of blood chemistry: Secondary | ICD-10-CM

## 2019-11-09 DIAGNOSIS — I10 Essential (primary) hypertension: Secondary | ICD-10-CM | POA: Diagnosis not present

## 2019-11-09 LAB — CBC WITH DIFFERENTIAL/PLATELET
Abs Immature Granulocytes: 0.01 10*3/uL (ref 0.00–0.07)
Basophils Absolute: 0 10*3/uL (ref 0.0–0.1)
Basophils Relative: 0 %
Eosinophils Absolute: 0.1 10*3/uL (ref 0.0–0.5)
Eosinophils Relative: 2 %
HCT: 38.6 % (ref 36.0–46.0)
Hemoglobin: 12.1 g/dL (ref 12.0–15.0)
Immature Granulocytes: 0 %
Lymphocytes Relative: 47 %
Lymphs Abs: 3 10*3/uL (ref 0.7–4.0)
MCH: 29.5 pg (ref 26.0–34.0)
MCHC: 31.3 g/dL (ref 30.0–36.0)
MCV: 94.1 fL (ref 80.0–100.0)
Monocytes Absolute: 0.5 10*3/uL (ref 0.1–1.0)
Monocytes Relative: 7 %
Neutro Abs: 2.8 10*3/uL (ref 1.7–7.7)
Neutrophils Relative %: 44 %
Platelets: 232 10*3/uL (ref 150–400)
RBC: 4.1 MIL/uL (ref 3.87–5.11)
RDW: 15.1 % (ref 11.5–15.5)
WBC: 6.5 10*3/uL (ref 4.0–10.5)
nRBC: 0 % (ref 0.0–0.2)

## 2019-11-09 LAB — COMPREHENSIVE METABOLIC PANEL
ALT: 19 U/L (ref 0–44)
AST: 26 U/L (ref 15–41)
Albumin: 4.4 g/dL (ref 3.5–5.0)
Alkaline Phosphatase: 54 U/L (ref 38–126)
Anion gap: 8 (ref 5–15)
BUN: 22 mg/dL — ABNORMAL HIGH (ref 6–20)
CO2: 27 mmol/L (ref 22–32)
Calcium: 8.9 mg/dL (ref 8.9–10.3)
Chloride: 105 mmol/L (ref 98–111)
Creatinine, Ser: 0.76 mg/dL (ref 0.44–1.00)
GFR calc Af Amer: >60 mL/min (ref >60)
GFR calc non Af Amer: >60 mL/min (ref >60)
Glucose, Bld: 90 mg/dL (ref 70–99)
Potassium: 4.1 mmol/L (ref 3.5–5.1)
Sodium: 140 mmol/L (ref 135–145)
Total Bilirubin: 0.6 mg/dL (ref 0.3–1.2)
Total Protein: 8.3 g/dL — ABNORMAL HIGH (ref 6.5–8.1)

## 2019-11-09 MED ORDER — IOHEXOL 350 MG/ML SOLN
100.0000 mL | Freq: Once | INTRAVENOUS | Status: AC | PRN
  Administered 2019-11-09: 100 mL via INTRAVENOUS

## 2019-11-09 MED ORDER — ALBUTEROL SULFATE (2.5 MG/3ML) 0.083% IN NEBU
5.0000 mg | INHALATION_SOLUTION | Freq: Once | RESPIRATORY_TRACT | Status: AC
  Administered 2019-11-09: 5 mg via RESPIRATORY_TRACT
  Filled 2019-11-09: qty 6

## 2019-11-09 MED ORDER — SODIUM CHLORIDE (PF) 0.9 % IJ SOLN
INTRAMUSCULAR | Status: AC
  Filled 2019-11-09: qty 50

## 2019-11-09 NOTE — ED Triage Notes (Signed)
Pt had bloodwork done at Dr Lynnette Caffey office this afternoon and was told to come her for further evaluation because her bloodwork shows a possible blood clot. Pt was seen at Dr's office because she was having short of breath and chest pain

## 2019-11-10 LAB — TROPONIN I (HIGH SENSITIVITY)
Troponin I (High Sensitivity): 4 ng/L (ref <18)
Troponin I (High Sensitivity): 6 ng/L (ref <18)

## 2019-11-10 NOTE — Discharge Instructions (Addendum)
Your work-up today was overall reassuring.  Your scan of your chest did not show any evidence of blood clot in your lungs.  We discussed that a elevated D-dimer may indicate a clot in your legs, however my suspicion for this is low and you have decided to defer this procedure for now.  You are welcome to return to the ER for this procedure, or you may schedule this through your primary care doctor.  Please return to the ER if you have any worsening swelling, pain to your legs, or any other concerning or worsening symptoms.  Please follow-up with your primary care doctor within the week.  Please continue to take your omeprazole as directed.

## 2019-11-10 NOTE — ED Provider Notes (Signed)
[facility]-EMERGENCY DEPT Provider Note   CSN: [id number] Arrival date & time: 11/09/19  2012     History No chief complaint on file.   Sheila Benson is a 59 y.o. female.  HPI 59 year old female with a history of DM type II, GERD, hypertension, history of gastric sleeve (in 2019) presents to the ER from her PCP who referred her for an elevated D-dimer.  Patient states that she has been having chest pain and shortness of breath since Friday.  She states that she wrote it off to her GERD symptoms, but then on Sunday she started having left arm numbness which is not consistent with her typical GERD presentations.  She went to her PCPs office, and allegedly they reported an elevated D-dimer and that she needed to come to the ER to rule out a blood clot.  There are no notes of this to review in the medical chart.  She denies any diaphoresis, dizziness, syncope, chronic weakness, abdominal pain, nausea, vomiting, fevers, chills, cough.  She denies any recent URI symptoms or exposure to Covid.  She denies any current chest pain or shortness of breath or left arm numbness.  No recent travel, she has had gastric sleeve surgery 2 years ago but no recent surgeries.  She is not on any oral estrogen supplements.  No previous cardiac history.     Past Medical History:  Diagnosis Date  . Diabetes mellitus without complication (HCC)    TYPE 2  . GERD (gastroesophageal reflux disease)   . Hypertension     Patient Active Problem List   Diagnosis Date Noted  . Morbid obesity (HCC) 10/13/2017  . Essential hypertension 12/13/2013  . Ruptured appendicitis 03/14/2012    Past Surgical History:  Procedure Laterality Date  . APPENDECTOMY  8/13   ruptured, long hospital stay  . CARPAL TUNNEL RELEASE Bilateral   . CESAREAN SECTION     x3  . KNEE ARTHROSCOPY     Left  . LAPAROSCOPIC GASTRIC SLEEVE RESECTION    . LAPAROSCOPIC GASTRIC SLEEVE RESECTION N/A 10/13/2017   Procedure:  LAPAROSCOPIC GASTRIC SLEEVE RESECTION  AND ERAS PATHWAY;  Surgeon: Kinsinger, De Blanch, MD;  Location: [facility] ORS;  Service: General;  Laterality: N/A;     OB History   No obstetric history on file.     Family History  Problem Relation Age of Onset  . Diabetes Mother   . Hypertension Father   . Diabetes Sister   . Diabetes Brother   . Diabetes Brother   . Diabetes Sister   . Heart disease Other     Social History   Tobacco Use  . Smoking status: Never Smoker  . Smokeless tobacco: Never Used  Vaping Use  . Vaping Use: Never used  Substance Use Topics  . Alcohol use: Never  . Drug use: Never    Home Medications Prior to Admission medications   Medication Sig Start Date End Date Taking? Authorizing Provider  losartan (COZAAR) 100 MG tablet Take 100 mg by mouth daily. 05/18/19  Yes [provider]  metoprolol succinate (TOPROL-XL) 25 MG 24 hr tablet Take 25 mg by mouth daily.   Yes [provider]  losartan-hydrochlorothiazide (HYZAAR) 100-12.5 MG tablet Take 1 tablet by mouth daily. Patient not taking: Reported on 11/09/2019 10/27/15   Trena Platt D, PA  ondansetron (ZOFRAN ODT) 4 MG disintegrating tablet Take 1 tablet (4 mg total) by mouth every 8 (eight) hours as needed for nausea or vomiting. Patient not  taking: Reported on 11/09/2019 10/14/17   Kinsinger, De Blanch, MD  pantoprazole (PROTONIX) 40 MG tablet Take 1 tablet (40 mg total) by mouth daily. Patient not taking: Reported on 11/09/2019 10/14/17   Kinsinger, De Blanch, MD  traMADol (ULTRAM) 50 MG tablet Take 1 tablet (50 mg total) by mouth every 8 (eight) hours as needed for up to 1 dose. Patient not taking: Reported on 11/09/2019 10/14/17   Kinsinger, De Blanch, MD    Allergies    Other and Pollen extract  Review of Systems   Review of Systems  Constitutional: Negative for chills and fever.  HENT: Negative for ear pain and sore throat.   Eyes: Negative for pain and visual disturbance.    Respiratory: Positive for shortness of breath. Negative for cough.   Cardiovascular: Positive for chest pain. Negative for palpitations.  Gastrointestinal: Negative for abdominal pain and vomiting.  Genitourinary: Negative for dysuria and hematuria.  Musculoskeletal: Negative for arthralgias and back pain.  Skin: Negative for color change and rash.  Neurological: Positive for numbness (Intermittent to left arm). Negative for seizures and syncope.  All other systems reviewed and are negative.   Physical Exam Updated Vital Signs BP (!) 166/85   Pulse 92   Temp 98.5 F (36.9 C) (Oral)   Resp 20   Ht 5\' 4"  (1.626 m)   Wt 90.3 kg   LMP 01/27/2012 Comment: patient signed preg test waiver  SpO2 100%   BMI 34.16 kg/m   Physical Exam Vitals and nursing note reviewed.  Constitutional:      General: She is not in acute distress.    Appearance: Normal appearance. She is well-developed. She is not ill-appearing, toxic-appearing or diaphoretic.  HENT:     Head: Normocephalic and atraumatic.     Mouth/Throat:     Mouth: Mucous membranes are moist.     Pharynx: Oropharynx is clear.  Eyes:     Conjunctiva/sclera: Conjunctivae normal.  Cardiovascular:     Rate and Rhythm: Normal rate and regular rhythm.     Pulses: Normal pulses.     Heart sounds: Normal heart sounds. No murmur heard.      Comments: 2+ radial pulses Pulmonary:     Effort: Pulmonary effort is normal. No respiratory distress.     Breath sounds: Normal breath sounds.     Comments: Respiratory effort normal, equal chest rise, lungs clear without rhonchi, rales, wheezes Abdominal:     General: Abdomen is flat.     Palpations: Abdomen is soft.     Tenderness: There is no abdominal tenderness.     Comments: Abdomen soft and nontender  Musculoskeletal:        General: No deformity.     Cervical back: Neck supple.     Right lower leg: No edema.     Left lower leg: No edema.  Skin:    General: Skin is warm and dry.      Capillary Refill: Capillary refill takes less than 2 seconds.  Neurological:     General: No focal deficit present.     Mental Status: She is alert and oriented to person, place, and time.     Sensory: No sensory deficit.     Motor: No weakness.  Psychiatric:        Mood and Affect: Mood normal.        Behavior: Behavior normal.     ED Results / Procedures / Treatments   Labs (all labs ordered are listed, but only abnormal  results are displayed) Labs Reviewed  COMPREHENSIVE METABOLIC PANEL - Abnormal; Notable for the following components:      Result Value   BUN 22 (*)    Total Protein 8.3 (*)    All other components within normal limits  CBC WITH DIFFERENTIAL/PLATELET  TROPONIN I (HIGH SENSITIVITY)  TROPONIN I (HIGH SENSITIVITY)    EKG EKG Interpretation  Date/Time:  Tuesday November 09 2019 20:32:04 EDT Ventricular Rate:  81 PR Interval:    QRS Duration: 73 QT Interval:  372 QTC Calculation: 432 R Axis:   48 Text Interpretation: Sinus rhythm ST elevation suggests acute pericarditis 12 Lead; Mason-Likar slightly more peaked T waves compared to prior but overall similar Confirmed by Frederick Peers [phone]) on 11/09/2019 9:45:43 PM   Radiology DG Chest 2 View  Result Date: 11/09/2019 CLINICAL DATA:  Shortness of breath EXAM: CHEST - 2 VIEW COMPARISON:  05/14/2017 FINDINGS: The heart size and mediastinal contours are within normal limits. Both lungs are clear. The visualized skeletal structures are unremarkable. IMPRESSION: Normal study. Electronically Signed   By: Charlett Nose M.D.   On: 11/09/2019 21:14   CT Angio Chest PE W and/or Wo Contrast  Result Date: 11/09/2019 CLINICAL DATA:  Short of breath, chest pain, abnormal blood work EXAM: CT ANGIOGRAPHY CHEST WITH CONTRAST TECHNIQUE: Multidetector CT imaging of the chest was performed using the standard protocol during bolus administration of intravenous contrast. Multiplanar CT image reconstructions and MIPs were obtained to  evaluate the vascular anatomy. CONTRAST:  OMNIPAQUE IOHEXOL 350 MG/ML SOLN COMPARISON:  11/09/2019 FINDINGS: Cardiovascular: This is a technically adequate evaluation of the pulmonary vasculature. There are no filling defects or pulmonary emboli. The heart is unremarkable without pericardial effusion. Minimal atherosclerosis within the aortic arch. Mediastinum/Nodes: No enlarged mediastinal, hilar, or axillary lymph nodes. Thyroid gland, trachea, and esophagus demonstrate no significant findings. Lungs/Pleura: No airspace disease, effusion, or pneumothorax. Central airways are patent. Upper Abdomen: No acute abnormality. Musculoskeletal: No acute or destructive bony lesions. Reconstructed images demonstrate no additional findings. Review of the MIP images confirms the above findings. IMPRESSION: 1. No evidence of pulmonary embolus. 2. No acute intrathoracic process. Electronically Signed   By: Sharlet Salina M.D.   On: 11/09/2019 23:13    Procedures Procedures (including critical care time)  Medications Ordered in ED Medications  sodium chloride (PF) 0.9 % injection (  Not Given 11/09/19 2247)  albuterol (PROVENTIL) (2.5 MG/3ML) 0.083% nebulizer solution 5 mg (5 mg Nebulization Given 11/09/19 2153)  iohexol (OMNIPAQUE) 350 MG/ML injection 100 mL (100 mLs Intravenous Contrast Given 11/09/19 2258)    ED Course  I have reviewed the triage vital signs and the nursing notes.  Pertinent labs & imaging results that were available during my care of the patient were reviewed by me and considered in my medical decision making (see chart for details).    MDM Rules/Calculators/A&P                         59 year old with elevated positive D-dimer from PCPs office with 4-day history of chest pain and shortness of breath On presentation to the ER, the patient is alert and oriented, nontoxic-appearing, no acute respiratory distress.  She is nondiaphoretic.  Physical exam without any acute abnormalities.   She is slightly hypertensive in the ED, but other vitals are reassuring.  She is not tachycardic, sats at 100%.  CMP without significant electrolyte abnormalities, normal BUN/creatinine.  Normal AST/ALT.  CBC without leukocytosis, normal  hemoglobin.  Chest x-ray without acute abnormality.  CTA without evidence of pulmonary embolism.  Serial troponins downtrending and normal.  EKG with slightly more peaked T waves but largely unchanged from previous, reviewed by Dr. Rex Kras.  On further discussion, patient reports that she has some left leg pain.  She states she has a history of varicose veins and this has been bothering her recently.  However she states her pain levels have been unchanged, and it is on her upper left outer quad. She has not noted any swelling or redness to the area.  We discussed that an elevated D-dimer may indicate a clot in the legs as well, however my suspicion for this is low.  She does not have any tenderness or erythema to the proximal gluteal/calf area, and no tenderness to palpation to her left outer leg.  I gave the patient the option of doing a outpatient ultrasound given that we do not have the resources here in the ER tonight, however she declined which I think is reasonable.  She has no warmth, swelling, redness or tenderness to palpation anywhere in her calf.  She does not have any risk factors for DVTs.   Overall work-up reassuring.  Unclear source of her symptoms, though her GERD may be contributing to this.  Encouraged her to continue taking the omeprazole that she has prescribed.  I discussed return precautions with the patient which included worsening shortness of breath, chest pain, swelling to her lower extremities, redness, or any other concerning signs or symptoms.  Encouraged her to follow-up with her PCP.  She is overall reassured by the work-up.  All the patient's questions have been answered to her satisfaction, she voices understanding and is agreeable to this plan.   At this stage in ED course, the patient is medically screened and is stable for discharge.  I discussed the case with Dr. Rex Kras who is agreeable to the above plan and disposition. Final Clinical Impression(s) / ED Diagnoses Final diagnoses:  Positive D-dimer  Chest pain, unspecified type  Shortness of breath    Rx / DC Orders ED Discharge Orders    None       Lyndel Safe 11/10/19 0059    Little, Wenda Overland, MD 11/10/19 1032

## 2020-11-20 ENCOUNTER — Ambulatory Visit
Admission: RE | Admit: 2020-11-20 | Discharge: 2020-11-20 | Disposition: A | Discharge status: Home / Self Care | Payer: 59 | Source: Ambulatory Visit | Attending: Internal Medicine | Admitting: Internal Medicine

## 2020-11-20 ENCOUNTER — Other Ambulatory Visit: Payer: Self-pay | Admitting: Internal Medicine

## 2020-11-20 DIAGNOSIS — R0602 Shortness of breath: Secondary | ICD-10-CM

## 2021-06-15 ENCOUNTER — Other Ambulatory Visit: Payer: Self-pay | Admitting: Internal Medicine

## 2021-06-15 DIAGNOSIS — K118 Other diseases of salivary glands: Secondary | ICD-10-CM

## 2021-07-03 IMAGING — CR DG CHEST 2V
2 series · 2 of 2 positions shown · non-contrast
Comparison: 05/14/2017

CLINICAL DATA: Shortness of breath

EXAM:
CHEST - 2 VIEW

[w chest pa]
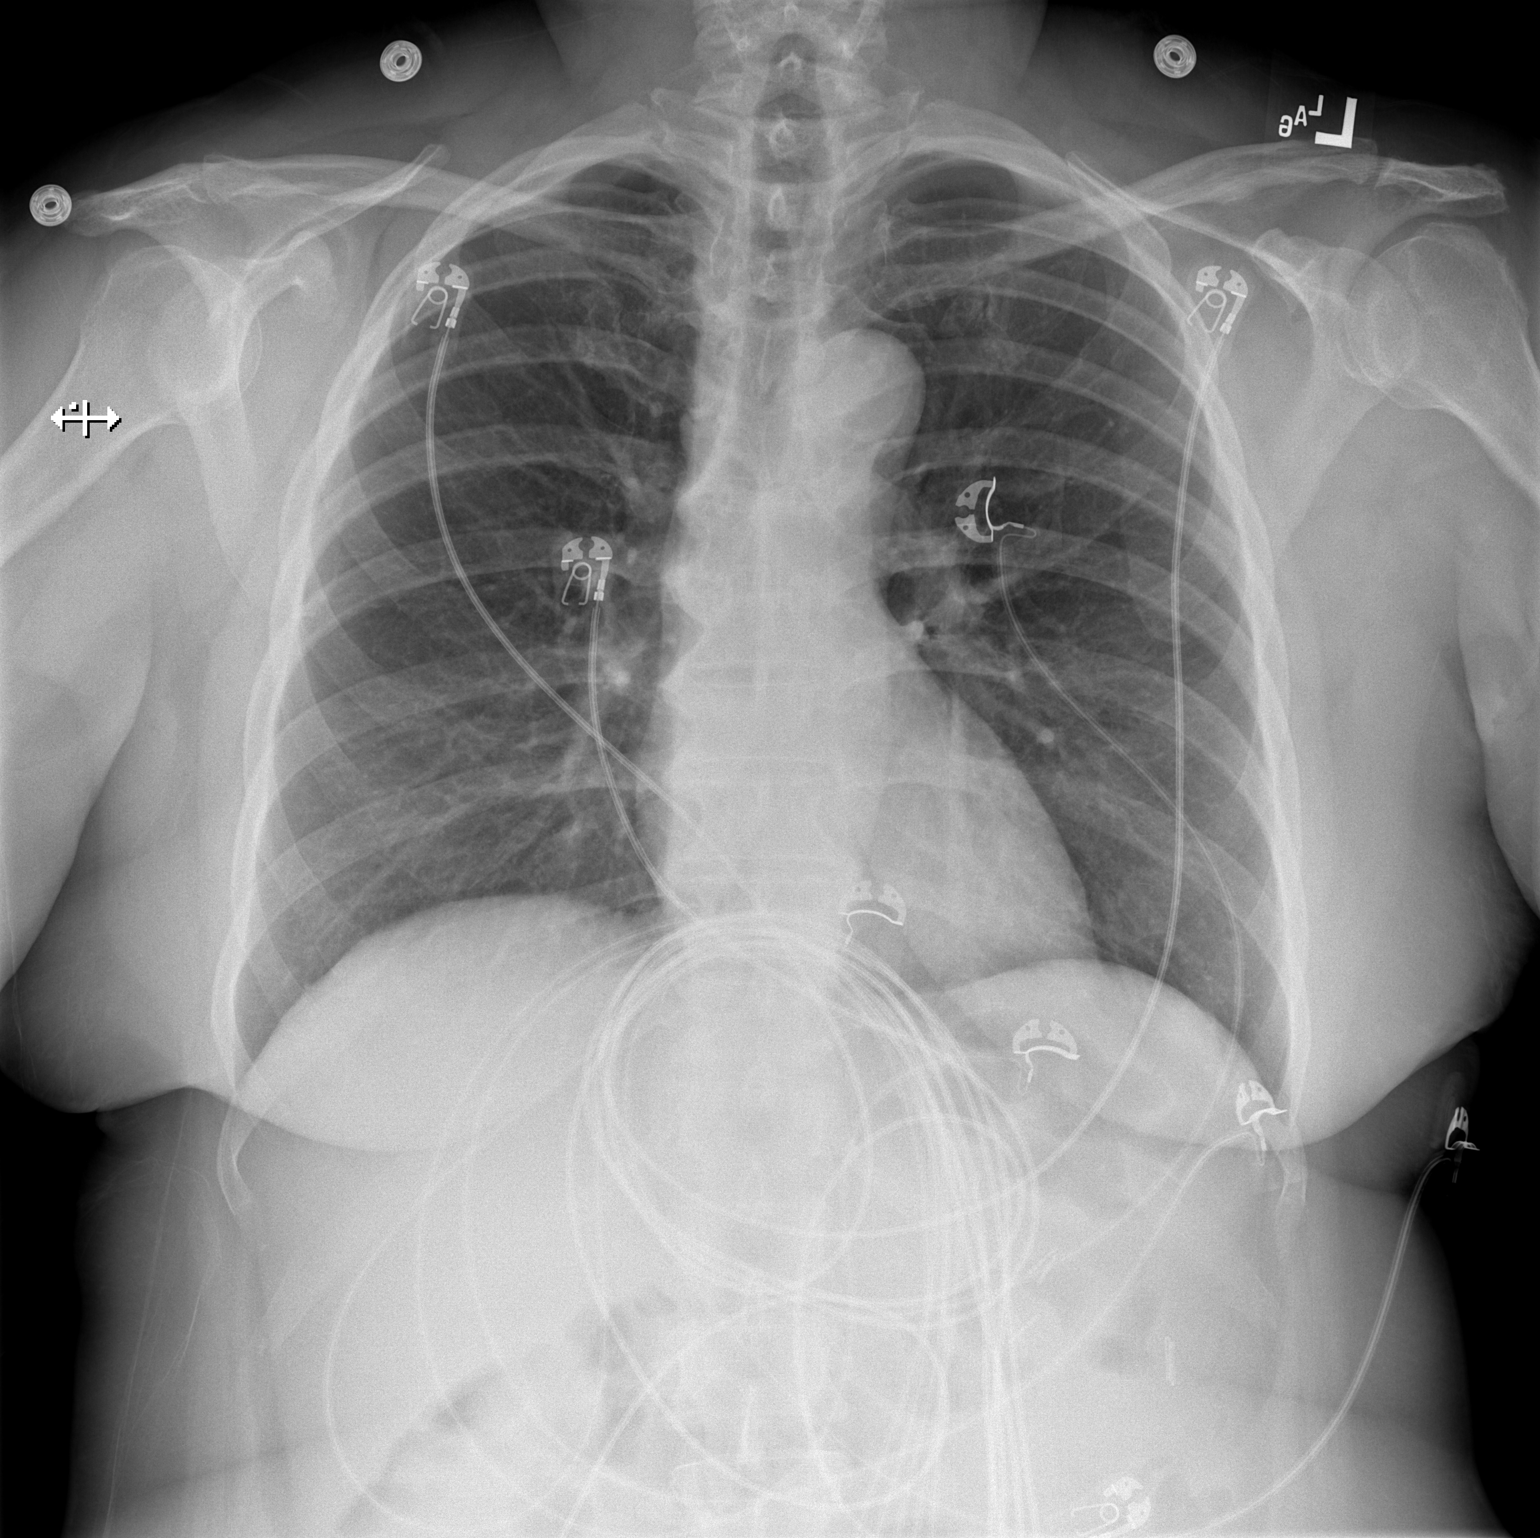

[w chest lat]
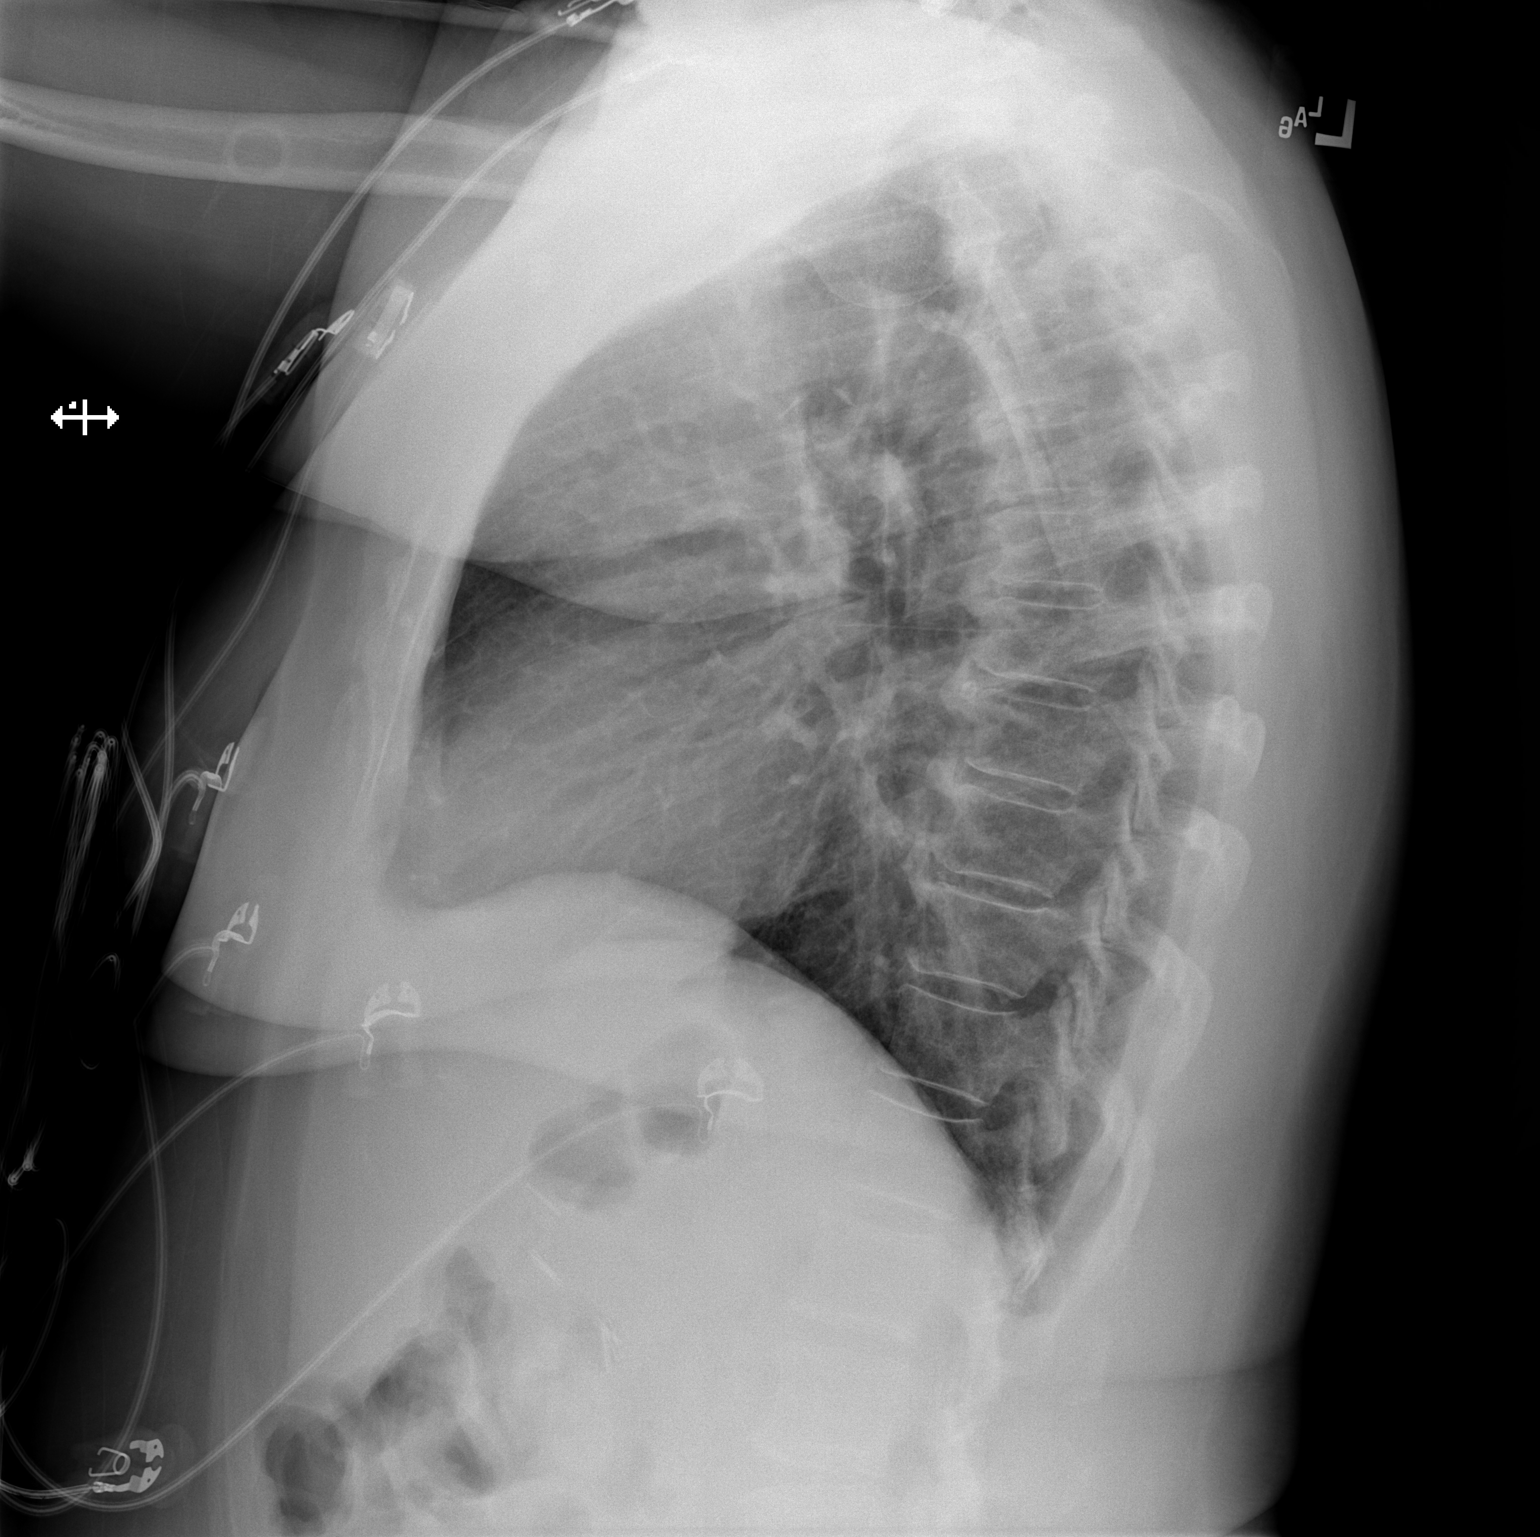

[2 of 2 positions shown; findings below may reference images not displayed]

FINDINGS: The heart size and mediastinal contours are within normal limits.
Both lungs are clear. The visualized skeletal structures are
unremarkable.
IMPRESSION: Normal study.

## 2021-07-11 ENCOUNTER — Ambulatory Visit
Admission: RE | Admit: 2021-07-11 | Discharge: 2021-07-11 | Disposition: A | Discharge status: Home / Self Care | Payer: 59 | Source: Ambulatory Visit | Attending: Internal Medicine | Admitting: Internal Medicine

## 2021-07-11 ENCOUNTER — Other Ambulatory Visit: Payer: Self-pay

## 2021-07-11 DIAGNOSIS — K118 Other diseases of salivary glands: Secondary | ICD-10-CM

## 2021-07-11 MED ORDER — IOPAMIDOL (ISOVUE-300) INJECTION 61%
80.0000 mL | Freq: Once | INTRAVENOUS | Status: AC | PRN
  Administered 2021-07-11: 80 mL via INTRAVENOUS

## 2022-05-10 ENCOUNTER — Encounter (HOSPITAL_COMMUNITY): Payer: Self-pay | Admitting: *Deleted

## 2023-05-07 ENCOUNTER — Encounter (HOSPITAL_COMMUNITY): Payer: Self-pay | Admitting: *Deleted

## 2024-05-13 ENCOUNTER — Encounter (HOSPITAL_COMMUNITY): Payer: Self-pay | Admitting: *Deleted
